# Patient Record
Sex: Female | Born: 2000 | Race: White | Hispanic: No | Marital: Married | Smoking: Never smoker
Health system: Southern US, Community
[De-identification: ages and names within clinical notes are randomized; demographics above are authoritative.]

## PROBLEM LIST (undated history)

## (undated) DIAGNOSIS — R519 Headache, unspecified: Secondary | ICD-10-CM

## (undated) DIAGNOSIS — F419 Anxiety disorder, unspecified: Secondary | ICD-10-CM

## (undated) DIAGNOSIS — R51 Headache: Secondary | ICD-10-CM

## (undated) DIAGNOSIS — D649 Anemia, unspecified: Secondary | ICD-10-CM

## (undated) HISTORY — DX: Anemia, unspecified: D64.9

---
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
## 2018-01-10 DIAGNOSIS — M25561 Pain in right knee: Secondary | ICD-10-CM | POA: Diagnosis not present

## 2018-01-17 DIAGNOSIS — S8391XD Sprain of unspecified site of right knee, subsequent encounter: Secondary | ICD-10-CM | POA: Diagnosis not present

## 2018-08-19 ENCOUNTER — Other Ambulatory Visit: Payer: Self-pay

## 2018-08-19 ENCOUNTER — Ambulatory Visit (INDEPENDENT_AMBULATORY_CARE_PROVIDER_SITE_OTHER): Payer: Managed Care, Other (non HMO) | Admitting: Family Medicine

## 2018-08-19 DIAGNOSIS — R5383 Other fatigue: Secondary | ICD-10-CM | POA: Diagnosis not present

## 2018-08-19 NOTE — Progress Notes (Signed)
   Subjective:    Patient ID: Nicole Mckinney, female    DOB: 28-Jul-2000, 18 y.o.   MRN: [id number]  HPIfatigue and dizziness. Comes and goes since last march. Dizziness when standing up quickly.  Patient also has substantial fatigue.  Worse in recent months.  Claims no depression.  Claims no distress.  She is tired and fatigued.  She and family concerned something going on.  With her physical status.  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  Location: Patient: home Provider: office   I discussed the limitations of evaluation and management by telemedicine and the availability of in person appointments. The patient expressed understanding and agreed to proceed.  History of Present Illness:    Observations/Objective:   Assessment and Plan:   Follow Up Instructions:    I discussed the assessment and treatment plan with the patient. The patient was provided an opportunity to ask questions and all were answered. The patient agreed with the plan and demonstrated an understanding of the instructions.   The patient was advised to call back or seek an in-person evaluation if the symptoms worsen or if the condition fails to improve as anticipated.  I provided of non-face-to-face time during this encounter.       Review of Systems No headache, no major weight loss or weight gain, no chest pain no back pain abdominal pain no change in bowel habits complete ROS otherwise negative     Objective:   Physical Exam   Virtual visit     Assessment & Plan:  Impression fatigue nonspecific we will check some blood work as requested.  Does have a history of anemia.  2.  Lightheadedness transient likely vasovagal and/or orthostatic discussed.

## 2018-08-20 ENCOUNTER — Encounter: Payer: Self-pay | Admitting: Family Medicine

## 2018-08-21 LAB — BASIC METABOLIC PANEL
BUN/Creatinine Ratio: 9 (ref 9–23)
BUN: 7 mg/dL (ref 6–20)
CO2: 21 mmol/L (ref 20–29)
Calcium: 9.4 mg/dL (ref 8.7–10.2)
Chloride: 102 mmol/L (ref 96–106)
Creatinine, Ser: 0.74 mg/dL (ref 0.57–1.00)
GFR calc Af Amer: 136 mL/min/{1.73_m2} (ref >59)
GFR calc non Af Amer: 118 mL/min/{1.73_m2} (ref >59)
Glucose: 76 mg/dL (ref 65–99)
Potassium: 4.2 mmol/L (ref 3.5–5.2)
Sodium: 138 mmol/L (ref 134–144)

## 2018-08-21 LAB — CBC WITH DIFFERENTIAL/PLATELET
Basophils Absolute: 0 10*3/uL (ref 0.0–0.2)
Basos: 1 %
EOS (ABSOLUTE): 0.1 10*3/uL (ref 0.0–0.4)
Eos: 2 %
Hematocrit: 33.9 % — ABNORMAL LOW (ref 34.0–46.6)
Hemoglobin: 11 g/dL — ABNORMAL LOW (ref 11.1–15.9)
Immature Grans (Abs): 0 10*3/uL (ref 0.0–0.1)
Immature Granulocytes: 0 %
Lymphocytes Absolute: 2.4 10*3/uL (ref 0.7–3.1)
Lymphs: 37 %
MCH: 27 pg (ref 26.6–33.0)
MCHC: 32.4 g/dL (ref 31.5–35.7)
MCV: 83 fL (ref 79–97)
Monocytes Absolute: 0.5 10*3/uL (ref 0.1–0.9)
Monocytes: 7 %
Neutrophils Absolute: 3.5 10*3/uL (ref 1.4–7.0)
Neutrophils: 53 %
Platelets: 360 10*3/uL (ref 150–450)
RBC: 4.07 x10E6/uL (ref 3.77–5.28)
RDW: 15.1 % (ref 11.7–15.4)
WBC: 6.5 10*3/uL (ref 3.4–10.8)

## 2018-08-21 LAB — HEPATIC FUNCTION PANEL
ALT: 14 IU/L (ref 0–32)
AST: 19 IU/L (ref 0–40)
Albumin: 3.9 g/dL (ref 3.9–5.0)
Alkaline Phosphatase: 76 IU/L (ref 39–117)
Bilirubin Total: <0.2 mg/dL (ref 0.0–1.2)
Bilirubin, Direct: 0.07 mg/dL (ref 0.00–0.40)
Total Protein: 6.7 g/dL (ref 6.0–8.5)

## 2018-08-21 LAB — LIPID PANEL
Chol/HDL Ratio: 4.2 ratio (ref 0.0–4.4)
Cholesterol, Total: 175 mg/dL — ABNORMAL HIGH (ref 100–169)
HDL: 42 mg/dL (ref >39)
LDL Calculated: 83 mg/dL (ref 0–109)
Triglycerides: 252 mg/dL — ABNORMAL HIGH (ref 0–89)
VLDL Cholesterol Cal: 50 mg/dL — ABNORMAL HIGH (ref 5–40)

## 2018-08-21 LAB — TSH: TSH: 4.14 u[IU]/mL (ref 0.450–4.500)

## 2018-11-11 ENCOUNTER — Encounter: Payer: BLUE CROSS/BLUE SHIELD | Admitting: Adult Health

## 2018-11-20 ENCOUNTER — Encounter: Payer: BLUE CROSS/BLUE SHIELD | Admitting: Advanced Practice Midwife

## 2018-12-04 ENCOUNTER — Ambulatory Visit (INDEPENDENT_AMBULATORY_CARE_PROVIDER_SITE_OTHER): Payer: 59 | Admitting: Advanced Practice Midwife

## 2018-12-04 ENCOUNTER — Other Ambulatory Visit: Payer: Self-pay

## 2018-12-04 ENCOUNTER — Encounter: Payer: Self-pay | Admitting: Advanced Practice Midwife

## 2018-12-04 VITALS — BP 118/84 | HR 96 | Ht 65.0 in | Wt 124.0 lb

## 2018-12-04 DIAGNOSIS — L700 Acne vulgaris: Secondary | ICD-10-CM

## 2018-12-04 DIAGNOSIS — D509 Iron deficiency anemia, unspecified: Secondary | ICD-10-CM

## 2018-12-04 DIAGNOSIS — R5383 Other fatigue: Secondary | ICD-10-CM | POA: Diagnosis not present

## 2018-12-04 LAB — POCT HEMOGLOBIN: Hemoglobin: 11.9 g/dL (ref 11–14.6)

## 2018-12-04 MED ORDER — DROSPIRENONE-ETHINYL ESTRADIOL 3-0.02 MG PO TABS: 1.0000 | ORAL_TABLET | Freq: Every day | ORAL | 11 refills | Status: DC

## 2018-12-04 NOTE — Progress Notes (Signed)
[facility] Visit  Patient name: Nicole Mckinney MRN [id number]  Date of birth: 2000-09-09  CC & HPI:  LOMETA RIGGIN is a 18 y.o. Caucasian female presenting today for birth contril refill. ON Yaz for 1 year from dermatologist (never had sex) but derm wouldn't continue beyond 1 year  ??  Pt says she had bloodwork that says she is B12 deficient, can't find that anywhere. At any rate, would need to see PCP for injections I imagine, as we don't carry that here. On FeSO4 orally since June for hgb 11.   Pertinent History Reviewed:  Medical & Surgical Hx:   Past Medical History:  Diagnosis Date  . Headache    No past surgical history on file. Family History  Problem Relation Age of Onset  . Hyperlipidemia Father     Current Outpatient Medications:  .  NON FORMULARY, Iron dietary supplement 65 mg, Disp: , Rfl:  .  PRESCRIPTION MEDICATION, Nikki 3 mg, Disp: , Rfl:  .  albuterol (PROVENTIL HFA;VENTOLIN HFA) 108 (90 BASE) MCG/ACT inhaler, Inhale 2 puffs into the lungs every 6 (six) hours as needed for wheezing. (Patient not taking: Reported on 12/04/2018), Disp: 1 Inhaler, Rfl: 2 .  diclofenac (VOLTAREN) 75 MG EC tablet, TAKE 1 TABLET BY MOUTH TWICE A DAY (Patient not taking: Reported on 08/19/2018), Disp: 42 tablet, Rfl: 0 .  doxycycline (DORYX) 100 MG EC tablet, Take 100 mg by mouth 2 (two) times daily. acne, Disp: , Rfl:  .  drospirenone-ethinyl estradiol (YAZ) 3-0.02 MG tablet, Take 1 tablet by mouth daily., Disp: 1 Package, Rfl: 11 .  fluticasone (FLONASE) 50 MCG/ACT nasal spray, Place 2 sprays into both nostrils daily. (Patient not taking: Reported on 08/01/2017), Disp: 16 g, Rfl: 5 .  omeprazole (PRILOSEC) 20 MG capsule, TAKE 1 CAPSULE BY MOUTH EVERY DAY (Patient not taking: Reported on 08/01/2017), Disp: 30 capsule, Rfl: 0 .  sucralfate (CARAFATE) 1 GM/10ML suspension, Take 10 mLs (1 g total) by mouth 4 (four) times daily -  with meals and at bedtime. (Patient not taking:  Reported on 08/01/2017), Disp: 420 mL, Rfl: 0 Social History: Reviewed -  reports that she has never smoked. She has never used smokeless tobacco.  Review of Systems:   Constitutional: Negative for fever and chills Eyes: Negative for visual disturbances Respiratory: Negative for shortness of breath, dyspnea Cardiovascular: Negative for chest pain or palpitations  Gastrointestinal: Negative for vomiting, diarrhea and constipation; no abdominal pain Genitourinary: Negative for dysuria and urgency, vaginal irritation or itching Musculoskeletal: Negative for back pain, joint pain, myalgias  Neurological: Negative for dizziness and headaches    Objective Findings:    Physical Examination: Vitals:   12/04/18 1008  BP: 118/84  Pulse: 96   General appearance - well appearing, and in no distress Mental status - alert, oriented to person, place, and time Chest:  Normal respiratory effort Heart - normal rate and regular rhythm Pelvic: deferred Musculoskeletal:  Normal range of motion without pain Extremities:  No edema    Results for orders placed or performed in visit on 12/04/18 (from the past 24 hour(s))  POCT hemoglobin   Collection Time: 12/04/18 10:56 AM  Result Value Ref Range   Hemoglobin 11.9 11 - 14.6 g/dL      Assessment & Plan:  A:   Acne management w/COCs P:  rx yaz for one more year--smoking warning given    No follow-ups on file.  Christin Fudge CNM 12/04/2018 11:43 AM

## 2018-12-18 ENCOUNTER — Telehealth: Payer: Self-pay | Admitting: Family Medicine

## 2018-12-18 NOTE — Telephone Encounter (Signed)
Patient woke up Sunday with a "knot" behind her ear and one in her neck area. Painful to touch.  Mom said that the one in her neck is getting bigger and is noticeable now. Before she couldn't see it, only feel it, but now she can. No fever or any other symptoms other than the knots. I wasn't sure where to schedule her at. Work-in today or put on Dr. Jeannine Kitten schedule tomorrow? In office or by phone?

## 2018-12-18 NOTE — Telephone Encounter (Signed)
Face to face with me tom

## 2018-12-19 ENCOUNTER — Encounter: Payer: Self-pay | Admitting: Family Medicine

## 2018-12-19 ENCOUNTER — Ambulatory Visit (INDEPENDENT_AMBULATORY_CARE_PROVIDER_SITE_OTHER): Payer: Managed Care, Other (non HMO) | Admitting: Family Medicine

## 2018-12-19 ENCOUNTER — Other Ambulatory Visit: Payer: Self-pay

## 2018-12-19 VITALS — BP 116/78 | Temp 97.7°F | Wt 123.6 lb

## 2018-12-19 DIAGNOSIS — I889 Nonspecific lymphadenitis, unspecified: Secondary | ICD-10-CM | POA: Diagnosis not present

## 2018-12-19 MED ORDER — DOXYCYCLINE HYCLATE 100 MG PO TABS: ORAL_TABLET | ORAL | 0 refills | Status: DC

## 2018-12-19 NOTE — Progress Notes (Signed)
   Subjective:    Patient ID: Nicole Mckinney, female    DOB: 2000-10-10, 18 y.o.   MRN: [id number]  HPI Pt here today for some knots on neck and jaw area. Pt has one spot on her neck, one knot behind right ear and one knot like area on jaw line on right side. Pt states these areas are painful. Pt has been taking Ibuprofen for pain with mild relief. These knot like areas came up Friday night. Pt states she is having jaw pain with the knot like area on jaw.    Review of Systems No headache no chest pain no back pain    Objective:   Physical Exam  Alert vitals stable, NAD. Blood pressure good on repeat. HEENT normal. Lungs clear. Heart regular rate and rhythm. Positive preauricular inflamed lymph node positive postauricular inflamed lymph node positive inflamed lymph node at the jaw.  1 pimple on left side of face none evident right side of face      Assessment & Plan:  Impression focal lymphadenitis.  Etiology unclear.  Will cover with Doxy 100 twice daily 10 days symptomatic care discussed.  Of note no history of cat scratch.  No systemic symptoms.

## 2019-04-02 ENCOUNTER — Ambulatory Visit (INDEPENDENT_AMBULATORY_CARE_PROVIDER_SITE_OTHER): Payer: Managed Care, Other (non HMO) | Admitting: Family Medicine

## 2019-04-02 ENCOUNTER — Encounter: Payer: Self-pay | Admitting: Family Medicine

## 2019-04-02 ENCOUNTER — Other Ambulatory Visit: Payer: Self-pay

## 2019-04-02 DIAGNOSIS — Z20828 Contact with and (suspected) exposure to other viral communicable diseases: Secondary | ICD-10-CM

## 2019-04-02 DIAGNOSIS — J329 Chronic sinusitis, unspecified: Secondary | ICD-10-CM | POA: Diagnosis not present

## 2019-04-02 DIAGNOSIS — J310 Chronic rhinitis: Secondary | ICD-10-CM | POA: Diagnosis not present

## 2019-04-02 DIAGNOSIS — Z20822 Contact with and (suspected) exposure to covid-19: Secondary | ICD-10-CM

## 2019-04-02 MED ORDER — CEFPROZIL 500 MG PO TABS: ORAL_TABLET | ORAL | 0 refills | Status: DC

## 2019-04-02 NOTE — Progress Notes (Signed)
   Subjective:  Audio only  Patient ID: Nicole Mckinney, female    DOB: October 21, 2000, 18 y.o.   MRN: [id number]  HPI Pt has been having a feeling of swelling in her throat. Pt states each morning she wakes [place] she has really bad discomfort in throat. Allergy med is helping some. Pt believes it is allergies. This has been going on for one week. No other symptoms.   Virtual Visit via Telephone Note  I connected with Nicole Mckinney on 04/02/19 at 11:00 AM EST by telephone and verified that I am speaking with the correct person using two identifiers.  Location: Patient: home Provider: office   I discussed the limitations, risks, security and privacy concerns of performing an evaluation and management service by telephone and the availability of in person appointments. I also discussed with the patient that there may be a patient responsible charge related to this service. The patient expressed understanding and agreed to proceed.   History of Present Illness:    Observations/Objective:   Assessment and Plan:   Follow Up Instructions:    I discussed the assessment and treatment plan with the patient. The patient was provided an opportunity to ask questions and all were answered. The patient agreed with the plan and demonstrated an understanding of the instructions.   The patient was advised to call back or seek an in-person evaluation if the symptoms worsen or if the condition fails to improve as anticipated.  I provided 20 minutes of non-face-to-face time during this encounter.  Discomfort in throat   Worse in the morn  Uses zyrtec helps a little   Usually zyrtec helps   Working as a Psychologist, counselling, LPN  Review of Systems No headache no chest pain no shortness of breath    Objective:   Physical Exam  Virtual      Assessment & Plan:  Impression upper respiratory symptoms with normal sore throat and sinus congestion.  Patient concerned about  potential move into the chest.  Albuterol on hand for as needed for wheezes.  Will cover with antibiotics.  Recommend Covid testing patient works in a Conservation officer, nature

## 2019-04-07 ENCOUNTER — Telehealth: Payer: Self-pay | Admitting: Family Medicine

## 2019-04-07 NOTE — Telephone Encounter (Signed)
Tried to contact patient to see if she had COVID test done. Left message to return call.

## 2019-04-09 NOTE — Telephone Encounter (Signed)
Pt did not get COVID testing. Pt states she took the prescribed med and began to feel better. Pt is feeling a lot better

## 2019-04-30 ENCOUNTER — Encounter: Payer: Self-pay | Admitting: Family Medicine

## 2019-05-01 ENCOUNTER — Encounter: Payer: Self-pay | Admitting: Family Medicine

## 2019-05-21 ENCOUNTER — Other Ambulatory Visit: Payer: Self-pay

## 2019-05-21 ENCOUNTER — Ambulatory Visit (INDEPENDENT_AMBULATORY_CARE_PROVIDER_SITE_OTHER): Payer: Managed Care, Other (non HMO) | Admitting: Family Medicine

## 2019-05-21 ENCOUNTER — Encounter: Payer: Self-pay | Admitting: Family Medicine

## 2019-05-21 VITALS — BP 124/82 | Temp 97.8°F | Wt 120.0 lb

## 2019-05-21 DIAGNOSIS — R238 Other skin changes: Secondary | ICD-10-CM | POA: Diagnosis not present

## 2019-05-21 DIAGNOSIS — T148XXA Other injury of unspecified body region, initial encounter: Secondary | ICD-10-CM

## 2019-05-21 NOTE — Progress Notes (Signed)
   Subjective:    Patient ID: Nicole Mckinney, female    DOB: [birth year], 19 y.o.   MRN: [id number]  HPI Pt here today for bruising on both leg and thigh areas on both legs. Right leg is worse. Pt states she does not know where they come from or how she gets them. Pt states they tend to turn greenish in color like they are going away but then they turn dark.   Pt has experienced bruising coming up on her anterior thighs for the past couple months.  Recalls no injury    No bruising elsewhere  No bleeding from any orifice  Not on any medications that contribute to bleeding  Concerns patient some    Objective:   Physical Exam Alert vitals stable, NAD. Blood pressure good on repeat. HEENT normal. Lungs clear. Heart regular rate and rhythm. aging bruises anterior thigh no other bruises elsewhere       Assessment & Plan:  Impression easy bruisability past 2 months.  We will do screening CBC to be on the safe side.  But doubt of bleeding diathesis at this time based on presentation.  Warning signs discussed

## 2019-05-22 ENCOUNTER — Encounter: Payer: Self-pay | Admitting: Family Medicine

## 2019-05-22 LAB — CBC WITH DIFFERENTIAL/PLATELET
Basophils Absolute: 0 10*3/uL (ref 0.0–0.2)
Basos: 1 %
EOS (ABSOLUTE): 0 10*3/uL (ref 0.0–0.4)
Eos: 1 %
Hematocrit: 37.3 % (ref 34.0–46.6)
Hemoglobin: 12.6 g/dL (ref 11.1–15.9)
Immature Grans (Abs): 0 10*3/uL (ref 0.0–0.1)
Immature Granulocytes: 0 %
Lymphocytes Absolute: 1.5 10*3/uL (ref 0.7–3.1)
Lymphs: 31 %
MCH: 30.1 pg (ref 26.6–33.0)
MCHC: 33.8 g/dL (ref 31.5–35.7)
MCV: 89 fL (ref 79–97)
Monocytes Absolute: 0.4 10*3/uL (ref 0.1–0.9)
Monocytes: 8 %
Neutrophils Absolute: 3 10*3/uL (ref 1.4–7.0)
Neutrophils: 59 %
Platelets: 313 10*3/uL (ref 150–450)
RBC: 4.19 x10E6/uL (ref 3.77–5.28)
RDW: 12.4 % (ref 11.7–15.4)
WBC: 4.9 10*3/uL (ref 3.4–10.8)

## 2019-10-21 ENCOUNTER — Ambulatory Visit: Payer: 59 | Admitting: Women's Health

## 2019-11-03 ENCOUNTER — Ambulatory Visit (INDEPENDENT_AMBULATORY_CARE_PROVIDER_SITE_OTHER): Payer: Managed Care, Other (non HMO) | Admitting: Women's Health

## 2019-11-03 ENCOUNTER — Encounter: Payer: Self-pay | Admitting: Women's Health

## 2019-11-03 VITALS — BP 126/80 | HR 89 | Ht 65.0 in | Wt 115.0 lb

## 2019-11-03 DIAGNOSIS — Z3041 Encounter for surveillance of contraceptive pills: Secondary | ICD-10-CM | POA: Diagnosis not present

## 2019-11-03 MED ORDER — DROSPIRENONE-ETHINYL ESTRADIOL 3-0.02 MG PO TABS: 1.0000 | ORAL_TABLET | Freq: Every day | ORAL | 11 refills | Status: DC

## 2019-11-03 NOTE — Progress Notes (Signed)
   GYN VISIT Patient name: Nicole Mckinney MRN [id number]  Date of birth: April 29, 2000 Chief Complaint:   Contraception (needs refill)  History of Present Illness:   Nicole Mckinney is a 19 y.o. G0P0000 Caucasian female being seen today for refill on COCs.  Was started on generic Yaz years ago by dermatologist for acne, doing really well on it, regulates periods. Not sexually active, never has been. Does not smoke, no h/o HTN, DVT/PE, CVA, MI, or migraines w/ aura.     Depression screen [facility] 2/9 08/01/2017  Decreased Interest 0  Down, Depressed, Hopeless 0  PHQ - 2 Score 0    No LMP recorded. (Menstrual status: Oral contraceptives). The current method of family planning is abstinence and OCP (estrogen/progesterone).  Last pap <21yo. Results were:  n/a Review of Systems:   Pertinent items are noted in HPI Denies fever/chills, dizziness, headaches, visual disturbances, fatigue, shortness of breath, chest pain, abdominal pain, vomiting, abnormal vaginal discharge/itching/odor/irritation, problems with periods, bowel movements, urination, or intercourse unless otherwise stated above.  Pertinent History Reviewed:  Reviewed past medical,surgical, social, obstetrical and family history.  Reviewed problem list, medications and allergies. Physical Assessment:   Vitals:   11/03/19 0938  BP: 126/80  Pulse: 89  Weight: 115 lb (52.2 kg)  Height: 5\' 5"  (1.651 m)  Body mass index is 19.14 kg/m.       Physical Examination:   General appearance: alert, well appearing, and in no distress  Mental status: alert, oriented to person, place, and time  Skin: warm & dry   Cardiovascular: normal heart rate noted  Respiratory: normal respiratory effort, no distress  Abdomen: soft, non-tender   Pelvic: examination not indicated  Extremities: no edema   Chaperone: n/a    No results found for this or any previous visit (from the past 24 hour(s)).  Assessment & Plan:  1) Contraception  management> refilled COCs, uses for acne and period management, not sexually active   Meds:  Meds ordered this encounter  Medications  . drospirenone-ethinyl estradiol (YAZ) 3-0.02 MG tablet    Sig: Take 1 tablet by mouth daily.    Dispense:  30 tablet    Refill:  11    Order Specific Question:   Supervising Provider    Answer:   [birth year]20[birth year]1996 H [[id number]]    No orders of the defined types were placed in this encounter.   Return for after 11/20 for , Pap & physical.  12/20 CNM, [facility] 11/03/2019 10:13 AM

## 2020-03-19 ENCOUNTER — Encounter (HOSPITAL_COMMUNITY): Payer: Self-pay | Admitting: Emergency Medicine

## 2020-03-19 ENCOUNTER — Emergency Department (HOSPITAL_COMMUNITY)
Admission: EM | Admit: 2020-03-19 | Discharge: 2020-03-19 | Disposition: A | Discharge status: Home / Self Care | Payer: Managed Care, Other (non HMO) | Attending: Emergency Medicine | Admitting: Emergency Medicine

## 2020-03-19 ENCOUNTER — Other Ambulatory Visit: Payer: Self-pay

## 2020-03-19 DIAGNOSIS — K208 Other esophagitis without bleeding: Secondary | ICD-10-CM

## 2020-03-19 DIAGNOSIS — K209 Esophagitis, unspecified without bleeding: Secondary | ICD-10-CM | POA: Diagnosis not present

## 2020-03-19 DIAGNOSIS — J45909 Unspecified asthma, uncomplicated: Secondary | ICD-10-CM | POA: Diagnosis not present

## 2020-03-19 DIAGNOSIS — R079 Chest pain, unspecified: Secondary | ICD-10-CM | POA: Diagnosis present

## 2020-03-19 HISTORY — DX: Anxiety disorder, unspecified: F41.9

## 2020-03-19 LAB — POC URINE PREG, ED: Preg Test, Ur: NEGATIVE

## 2020-03-19 MED ORDER — ONDANSETRON 8 MG PO TBDP
8.0000 mg | ORAL_TABLET | Freq: Once | ORAL | Status: AC
  Administered 2020-03-19: 8 mg via ORAL
  Filled 2020-03-19: qty 1

## 2020-03-19 NOTE — ED Notes (Signed)
Patient discharged to home.  All discharge instructions reviewed.  Patient able to verbalize understanding via teachback method.  Ambulatory out of ED.  

## 2020-03-19 NOTE — Discharge Instructions (Signed)
Make sure to take all pills with a full glass of water.  Do not lay down for 30 minutes after taking a pill.

## 2020-03-19 NOTE — ED Triage Notes (Addendum)
Pt c/o chest pain that started about 1 hour prior to arrival. Pt states it also feels like something is stuck in her throat. Pt states she took her Zoloft tonight prior to having chest pain. Pt very anxious in triage.

## 2020-03-19 NOTE — ED Provider Notes (Signed)
[facility] EMERGENCY DEPARTMENT Provider Note   CSN: [id number] Arrival date & time: 03/19/20  0002   History Chief Complaint  Patient presents with  . Chest Pain    Nicole Mckinney is a 19 y.o. female.  The history is provided by the patient.  Chest Pain She states that she took a pill tonight and feels like it got stuck in her esophagus.  And feels very uncomfortable.  While she was in the ED waiting for me to see her, she did vomit and the pill came up and she feels like she is back to normal.  She does admit that she took the pill without drinking any liquids with it, and lay down shortly after taking the pill.  She denies any chest pain and she currently is not having any nausea.  Past Medical History:  Diagnosis Date  . Anxiety   . Headache     Patient Active Problem List   Diagnosis Date Noted  . Anemia, iron deficiency 02/06/2014  . Exercise-induced asthma 07/22/2013  . Heart murmur 04/14/2013  . Allergic rhinitis 12/17/2012    History reviewed. No pertinent surgical history.   OB History    Gravida  0   Para  0   Term  0   Preterm  0   AB  0   Living  0     SAB  0   IAB  0   Ectopic  0   Multiple  0   Live Births  0           Family History  Problem Relation Age of Onset  . Hyperlipidemia Father     Social History   Tobacco Use  . Smoking status: Never Smoker  . Smokeless tobacco: Never Used  Vaping Use  . Vaping Use: Never used  Substance Use Topics  . Alcohol use: No  . Drug use: No    Home Medications Prior to Admission medications   Medication Sig Start Date End Date Taking? Authorizing Provider  drospirenone-ethinyl estradiol (YAZ) 3-0.02 MG tablet Take 1 tablet by mouth daily. 11/03/19   Cheral Marker, CNM  omeprazole (PRILOSEC) 20 MG capsule TAKE 1 CAPSULE BY MOUTH EVERY DAY Patient not taking: Reported on 08/01/2017 11/27/16 03/19/20  Campbell Riches, NP    Allergies    Patient has no known  allergies.  Review of Systems   Review of Systems  Cardiovascular: Positive for chest pain.  All other systems reviewed and are negative.   Physical Exam Updated Vital Signs BP 115/72   Pulse 77   Temp 98 F (36.7 C) (Oral)   Resp (!) 27   Ht 5\' 5"  (1.651 m)   Wt 52.2 kg   SpO2 100%   BMI 19.14 kg/m   Physical Exam Vitals and nursing note reviewed.   19 year old female, resting comfortably and in no acute distress. Vital signs are significant for elevated respiratory rate. Oxygen saturation is 100%, which is normal. Head is normocephalic and atraumatic. PERRLA, EOMI. Oropharynx is clear. Neck is nontender and supple without adenopathy or JVD. Back is nontender and there is no CVA tenderness. Lungs are clear without rales, wheezes, or rhonchi. Chest is nontender. Heart has regular rate and rhythm without murmur. Abdomen is soft, flat, nontender without masses or hepatosplenomegaly and peristalsis is normoactive. Extremities have no cyanosis or edema, full range of motion is present. Skin is warm and dry without rash. Neurologic: Mental status is normal, cranial nerves  are intact, there are no motor or sensory deficits.  ED Results / Procedures / Treatments   Labs (all labs ordered are listed, but only abnormal results are displayed) Labs Reviewed  BASIC METABOLIC PANEL  CBC  POC URINE PREG, ED  TROPONIN I (HIGH SENSITIVITY)    EKG EKG Interpretation  Date/Time:  Friday March 19 2020 00:25:54 EST Ventricular Rate:  86 PR Interval:  132 QRS Duration: 62 QT Interval:  336 QTC Calculation: 402 R Axis:   82 Text Interpretation: Normal sinus rhythm Nonspecific ST abnormality Abnormal ECG No old tracing to compare Confirmed by Dione Booze ([id number]) on 03/19/2020 12:35:54 AM  Procedures Procedures   Medications Ordered in ED Medications  ondansetron (ZOFRAN-ODT) disintegrating tablet 8 mg (8 mg Oral Given 03/19/20 0446)    ED Course  I have reviewed the  triage vital signs and the nursing notes.  Pertinent lab results that were available during my care of the patient were reviewed by me and considered in my medical decision making (see chart for details).  MDM Rules/Calculators/A&P Pill not stuck in her esophagus, resolved when she vomited the pill up.  No evidence of actual esophagitis.  Old records are reviewed, and she has no relevant past visits.  ECG is unremarkable.  Patient was reassured, advised to make sure she takes pills with a full glass of water and does not lay down for 30 minutes following taking pills.  Return precautions discussed.  Final Clinical Impression(s) / ED Diagnoses Final diagnoses:  Pill esophagitis    Rx / DC Orders ED Discharge Orders    None       Dione Booze, MD 03/19/20 [phone]

## 2020-09-20 DIAGNOSIS — E039 Hypothyroidism, unspecified: Secondary | ICD-10-CM | POA: Insufficient documentation

## 2020-11-19 ENCOUNTER — Other Ambulatory Visit: Payer: Self-pay | Admitting: Women's Health

## 2021-04-03 NOTE — L&D Delivery Note (Signed)
Obstetrical Delivery Note   Date of Delivery:   02/04/2022 Primary OB:   [facility] Gestational Age/EDD: [redacted]w[redacted]d Reason for Admission: Post dates IOL Antepartum complications: none  Delivered By:   Durene Romans MD  Delivery Type:   vacuum, low  Delivery Details:   Patient diagnosed with chorio just prior to starting to push. IVF bolus, tylenol given and ampicillin infusing with plan for gentamicin after the ampicillin was done. Patient pushing well and assessed after 30 minutes and the baby was LOA and +3 with BPD to +2. Pelvis felt adequate with EFW 3500gm. FHR tachy to the 200s-210s and I recommended operative vaginal delivery. Patient pushing well and declined OVD. NICU present. She pushed just to +4 and FHR in the 90s and stayed there and I told her I recommended vacuum assistance. Risk of larger tear, fetal injury including hematoma discussed with her and she was amenable to this. Kiwi vacuum applied to flexion point and FHR normal. With next two contractions, kiwi used per manufacturer's instructions and patient delivered the child; she did have a tight skin band at the introitus so patient amenable to right mediolateral episiotomy, which facilitated delivery with the second contraction and vacuum usage. Once head was delivered, suction released and baby was delivered without issue. 1 minue delayed cord clamping done and then infant handed to NICU team.  Anesthesia:    epidural Intrapartum complications: Chorioamnionitis GBS:    Positive   Laceration:    2nd degree. Repaired with 2-0 vicryl in the usual fashion and 3-0 monocryl for the skin in a subcuticular fashion Episiotomy:    Right mediolateral with 2nd degree extension Rectal exam:   Negative pre and post repair Placenta:    Delivered and expressed via active management. Intact: yes. To pathology: yes.  Delayed Cord Clamping: yes Estimated Blood Loss:  754mL Atony responsive to massage, pitocin and Lysteda  Baby:    Liveborn  female, APGARs 6/7/8, weight 3770gm Arterial: pH 7.19, CO2 60, bicar 22.9 Venous: pH 7.24, CO2 44  Durene Romans. MD Attending Center for [facility])

## 2021-06-03 ENCOUNTER — Ambulatory Visit (INDEPENDENT_AMBULATORY_CARE_PROVIDER_SITE_OTHER): Payer: 59 | Admitting: *Deleted

## 2021-06-03 ENCOUNTER — Other Ambulatory Visit: Payer: Self-pay

## 2021-06-03 ENCOUNTER — Encounter: Payer: Self-pay | Admitting: *Deleted

## 2021-06-03 VITALS — BP 119/80 | HR 106 | Ht 65.0 in | Wt 129.0 lb

## 2021-06-03 DIAGNOSIS — Z3201 Encounter for pregnancy test, result positive: Secondary | ICD-10-CM

## 2021-06-03 LAB — POCT URINE PREGNANCY: Preg Test, Ur: POSITIVE — AB

## 2021-06-03 NOTE — Progress Notes (Signed)
? ?  NURSE VISIT- PREGNANCY CONFIRMATION  ? ?SUBJECTIVE:  ?Nicole Mckinney is a 21 y.o. G1P0000 female at [redacted]w[redacted]d by certain LMP of Patient's last menstrual period was 04/22/2021. Here for pregnancy confirmation.  Home pregnancy test: positive x 5.   She reports nausea and vomiting.  She is taking prenatal vitamins.   ? ?OBJECTIVE:  ?BP 119/80 (BP Location: Left Arm, Patient Position: Sitting, Cuff Size: Normal)   Pulse (!) 106   Ht 5\' 5"  (1.651 m)   Wt 129 lb (58.5 kg)   LMP 04/22/2021   BMI 21.47 kg/m?   ?Appears well, in no apparent distress ? ?Results for orders placed or performed in visit on 06/03/21 (from the past 24 hour(s))  ?POCT urine pregnancy  ? Collection Time: 06/03/21  9:45 AM  ?Result Value Ref Range  ? Preg Test, Ur Positive (A) Negative  ? ? ?ASSESSMENT: ?Positive pregnancy test, [redacted]w[redacted]d by LMP   ? ?PLAN: ?Schedule for dating ultrasound in 2 weeks ?Prenatal vitamins: continue   ?Nausea medicines: not currently needed   ?OB packet given: Yes ? ?[redacted]w[redacted]d  ?06/03/2021 ?9:53 AM ? ?

## 2021-06-10 ENCOUNTER — Telehealth: Payer: Self-pay | Admitting: Women's Health

## 2021-06-10 NOTE — Telephone Encounter (Signed)
Patient called stating that she is a couple weeks into her pregnancy and she has a sinus infection and she was told that there is only a limited of things she can take while this early pregnant. She would like to speak with a nurse to see what she is able to take for it. Please contact pt  ?

## 2021-06-10 NOTE — Telephone Encounter (Signed)
Spoke with patient. Advised that she can use tylenol prn for headache, fever and pain. Advised that she can use saline nasal spray and vicks for congestion. Pt agreeable and no other questions at this time.  ?

## 2021-06-15 ENCOUNTER — Encounter: Payer: Self-pay | Admitting: *Deleted

## 2021-06-20 ENCOUNTER — Other Ambulatory Visit: Payer: Self-pay | Admitting: Obstetrics & Gynecology

## 2021-06-20 ENCOUNTER — Other Ambulatory Visit: Payer: Self-pay

## 2021-06-20 ENCOUNTER — Ambulatory Visit (INDEPENDENT_AMBULATORY_CARE_PROVIDER_SITE_OTHER): Payer: 59

## 2021-06-20 DIAGNOSIS — O3680X0 Pregnancy with inconclusive fetal viability, not applicable or unspecified: Secondary | ICD-10-CM

## 2021-06-20 NOTE — Progress Notes (Signed)
[place] 8+3 wks,single IUP with YS,FHR 161 bpm,normal ovaries,CRL 17.15 mm ?

## 2021-06-24 ENCOUNTER — Other Ambulatory Visit: Payer: 59

## 2021-07-15 ENCOUNTER — Other Ambulatory Visit: Payer: Self-pay | Admitting: Obstetrics & Gynecology

## 2021-07-15 DIAGNOSIS — Z3682 Encounter for antenatal screening for nuchal translucency: Secondary | ICD-10-CM

## 2021-07-18 ENCOUNTER — Ambulatory Visit: Payer: 59 | Admitting: *Deleted

## 2021-07-18 ENCOUNTER — Other Ambulatory Visit (HOSPITAL_COMMUNITY)
Admission: RE | Admit: 2021-07-18 | Discharge: 2021-07-18 | Disposition: A | Discharge status: Home / Self Care | Payer: 59 | Source: Ambulatory Visit | Attending: Women's Health | Admitting: Women's Health

## 2021-07-18 ENCOUNTER — Ambulatory Visit (INDEPENDENT_AMBULATORY_CARE_PROVIDER_SITE_OTHER): Payer: 59

## 2021-07-18 ENCOUNTER — Encounter: Payer: Self-pay | Admitting: Women's Health

## 2021-07-18 ENCOUNTER — Ambulatory Visit (INDEPENDENT_AMBULATORY_CARE_PROVIDER_SITE_OTHER): Payer: 59 | Admitting: Women's Health

## 2021-07-18 VITALS — BP 119/73 | HR 95 | Wt 130.0 lb

## 2021-07-18 DIAGNOSIS — Z3401 Encounter for supervision of normal first pregnancy, first trimester: Secondary | ICD-10-CM | POA: Insufficient documentation

## 2021-07-18 DIAGNOSIS — Z113 Encounter for screening for infections with a predominantly sexual mode of transmission: Secondary | ICD-10-CM | POA: Diagnosis present

## 2021-07-18 DIAGNOSIS — Z3682 Encounter for antenatal screening for nuchal translucency: Secondary | ICD-10-CM

## 2021-07-18 DIAGNOSIS — Z124 Encounter for screening for malignant neoplasm of cervix: Secondary | ICD-10-CM

## 2021-07-18 DIAGNOSIS — Z3400 Encounter for supervision of normal first pregnancy, unspecified trimester: Secondary | ICD-10-CM | POA: Insufficient documentation

## 2021-07-18 DIAGNOSIS — Z3A12 12 weeks gestation of pregnancy: Secondary | ICD-10-CM | POA: Insufficient documentation

## 2021-07-18 DIAGNOSIS — Z3402 Encounter for supervision of normal first pregnancy, second trimester: Secondary | ICD-10-CM

## 2021-07-18 LAB — POCT URINALYSIS DIPSTICK OB
Blood, UA: NEGATIVE
Glucose, UA: NEGATIVE
Ketones, UA: NEGATIVE
Leukocytes, UA: NEGATIVE
Nitrite, UA: NEGATIVE
POC,PROTEIN,UA: NEGATIVE

## 2021-07-18 NOTE — Progress Notes (Signed)
[place] 12+3 wks,measurements c/w dates,CRL 51.26 mm,normal ovaries,NB present,NT 1.1 mm,FHR 166 bpm ?

## 2021-07-18 NOTE — Progress Notes (Signed)
? ? ?INITIAL OBSTETRICAL VISIT ?Patient name: Nicole Mckinney MRN [id number]  Date of birth: 11-20-2000 ?Chief Complaint:   ?Initial Prenatal Visit ? ?History of Present Illness:   ?Nicole Mckinney is a 21 y.o. G29P0000 Caucasian female at [redacted]w[redacted]d by LMP c/w u/s at 8 weeks with an Estimated Date of Delivery: 01/27/22 being seen today for her initial obstetrical visit.   ?Patient's last menstrual period was 04/22/2021. ?Her obstetrical history is significant for primigravida.   ?Today she reports no complaints.  ?Last pap never. Results were: N/A ? ? ?  07/18/2021  ?  2:01 PM 08/01/2017  ?  8:46 AM  ?Depression screen PHQ 2/9  ?Decreased Interest 0 0  ?Down, Depressed, Hopeless 0 0  ?PHQ - 2 Score 0 0  ?Altered sleeping 0   ?Tired, decreased energy 1   ?Change in appetite 0   ?Feeling bad or failure about yourself  0   ?Trouble concentrating 0   ?Moving slowly or fidgety/restless 0   ?Suicidal thoughts 0   ?PHQ-9 Score 1   ? ?  ? ?  07/18/2021  ?  2:01 PM  ?GAD 7 : Generalized Anxiety Score  ?Nervous, Anxious, on Edge 1  ?Control/stop worrying 1  ?Worry too much - different things 1  ?Trouble relaxing 0  ?Restless 0  ?Easily annoyed or irritable 0  ?Afraid - awful might happen 0  ?Total GAD 7 Score 3  ? ? ? ?Review of Systems:   ?Pertinent items are noted in HPI ?Denies cramping/contractions, leakage of fluid, vaginal bleeding, abnormal vaginal discharge w/ itching/odor/irritation, headaches, visual changes, shortness of breath, chest pain, abdominal pain, severe nausea/vomiting, or problems with urination or bowel movements unless otherwise stated above.  ?Pertinent History Reviewed:  ?Reviewed past medical,surgical, social, obstetrical and family history.  ?Reviewed problem list, medications and allergies. ?OB History  ?Gravida Para Term Preterm AB Living  ?1 0 0 0 0 0  ?SAB IAB Ectopic Multiple Live Births  ?0 0 0 0 0  ?  ?# Outcome Date GA Lbr Len/2nd Weight Sex Delivery Anes PTL Lv  ?1 Current           ? ?Physical  Assessment:  ? ?Vitals:  ? 07/18/21 1409  ?BP: 119/73  ?Pulse: 95  ?Weight: 130 lb (59 kg)  ?Body mass index is 21.63 kg/m?. ? ?     Physical Examination: ? General appearance - well appearing, and in no distress ? Mental status - alert, oriented to person, place, and time ? Psych:  She has a normal mood and affect ? Skin - warm and dry, normal color, no suspicious lesions noted ? Chest - effort normal, all lung fields clear to auscultation bilaterally ? Heart - normal rate and regular rhythm ? Abdomen - soft, nontender ? Extremities:  No swelling or varicosities noted ? Thin prep pap is done  ? ?Chaperone: Faith Rogue   ? ?TODAY'S NT [place] 12+3 wks,measurements c/w dates,CRL 51.26 mm,normal ovaries,NB present,NT 1.1 mm,FHR 166 bpm ? ?Results for orders placed or performed in visit on 07/18/21 (from the past 24 hour(s))  ?POC Urinalysis Dipstick OB  ? Collection Time: 07/18/21  2:30 PM  ?Result Value Ref Range  ? Color, UA    ? Clarity, UA    ? Glucose, UA Negative Negative  ? Bilirubin, UA    ? Ketones, UA neg   ? Spec Grav, UA    ? Blood, UA neg   ? pH, UA    ?  POC,PROTEIN,UA Negative Negative, Trace, Small (1+), Moderate (2+), Large (3+), 4+  ? Urobilinogen, UA    ? Nitrite, UA neg   ? Leukocytes, UA Negative Negative  ? Appearance    ? Odor    ?  ?Assessment & Plan:  ?1) Low-Risk Pregnancy G1P0000 at [redacted]w[redacted]d with an Estimated Date of Delivery: 01/27/22  ? ?2) Initial OB visit ? ?3) Cervical cancer screening ? ?Meds: No orders of the defined types were placed in this encounter. ? ? ?Initial labs obtained ?Continue prenatal vitamins ?Reviewed n/v relief measures and warning s/s to report ?Reviewed recommended weight gain based on pre-gravid BMI ?Encouraged well-balanced diet ?Genetic & carrier screening discussed: requests Panorama and NT/IT, declines Horizon  ?Ultrasound discussed; fetal survey: requested ?CCNC completed> form faxed if has or is planning to apply for medicaid ?The nature of [facility] with multiple MDs and other Advanced Practice Providers was explained to patient; also emphasized that fellows, residents, and students are part of our team. ? ?Indications for ASA therapy (per uptodate) ?None ? ?Indications for early A1C (per uptodate) ?BMI >=25 (>=23 in Asian women) AND one of the following ?None ? ?Follow-up: Return in about 4 weeks (around 08/15/2021) for LROB, 2nd IT, CNM, in person; then 7wks from now for [facility] w/ anatomy u/s.  ? ?Orders Placed This Encounter  ?Procedures  ? Urine Culture  ? Integrated 1  ? Genetic Screening  ? CBC/D/Plt+RPR+Rh+ABO+RubIgG...  ? POC Urinalysis Dipstick OB  ? ? ?Cheral Marker CNM, WHNP-BC ?07/18/2021 ?2:45 PM  ?

## 2021-07-18 NOTE — Patient Instructions (Signed)
Nicole Mckinney, thank you for choosing our office today! We appreciate the opportunity to meet your healthcare needs. You may receive a short survey by mail, e-mail, or through [employer]. If you are happy with your care we would appreciate if you could take just a few minutes to complete the survey questions. We read all of your comments and take your feedback very seriously. Thank you again for choosing our office.  ?Center for [employer] Team at [facility] ? [facility] & [place] at [facility] ?([address] [place], [place] [address]) ?Entrance C, located off of [address] ?Free 24/7 valet parking  ? Nausea & Vomiting ?Have saltine crackers or pretzels by your bed and eat a few bites before you raise your head out of bed in the morning ?Eat small frequent meals throughout the day instead of large meals ?Drink plenty of fluids throughout the day to stay hydrated, just don't drink a lot of fluids with your meals.  This can make your stomach fill up faster making you feel sick ?Do not brush your teeth right after you eat ?Products with real ginger are good for nausea, like ginger ale and ginger hard candy Make sure it says made with real ginger! ?Sucking on sour candy like lemon heads is also good for nausea ?If your prenatal vitamins make you nauseated, take them at night so you will sleep through the nausea ?Sea Bands ?If you feel like you need medicine for the nausea & vomiting please let us know ?If you are unable to keep any fluids or food down please let us know ? ? Constipation ?Drink plenty of fluid, preferably water, throughout the day ?Eat foods high in fiber such as fruits, vegetables, and grains ?Exercise, such as walking, is a good way to keep your bowels regular ?Drink warm fluids, especially warm prune juice, or decaf coffee ?Eat a 1/2 cup of real oatmeal (not instant), 1/2 cup applesauce, and 1/2-1 cup warm prune juice every day ?If needed, you may take Colace (docusate sodium) stool softener  once or twice a day to help keep the stool soft.  ?If you still are having problems with constipation, you may take Miralax once daily as needed to help keep your bowels regular.  ? ?Home Blood Pressure Monitoring for Patients  ? ?Your provider has recommended that you check your blood pressure (BP) at least once a week at home. If you do not have a blood pressure cuff at home, one will be provided for you. Contact your provider if you have not received your monitor within 1 week.  ? ?Helpful Tips for Accurate Home Blood Pressure Checks  ?Don't smoke, exercise, or drink caffeine 30 minutes before checking your BP ?Use the restroom before checking your BP (a full bladder can raise your pressure) ?Relax in a comfortable upright chair ?Feet on the ground ?Left arm resting comfortably on a flat surface at the level of your heart ?Legs uncrossed ?Back supported ?Sit quietly and don't talk ?Place the cuff on your bare arm ?Adjust snuggly, so that only two fingertips can fit between your skin and the top of the cuff ?Check 2 readings separated by at least one minute ?Keep a log of your BP readings ?For a visual, please reference this diagram: http://ccnc.care/bpdiagram ? ?Provider Name: [facility]     Phone: [phone] ? ?Zone 1: ALL CLEAR  ?Continue to monitor your symptoms:  ?BP reading is less than 140 (top number) or less than 90 (bottom  number)  ?No right upper stomach pain ?No headaches or seeing spots ?No feeling nauseated or throwing up ?No swelling in face and hands ? ?Zone 2: CAUTION ?Call your doctor's office for any of the following:  ?BP reading is greater than 140 (top number) or greater than 90 (bottom number)  ?Stomach pain under your ribs in the middle or right side ?Headaches or seeing spots ?Feeling nauseated or throwing up ?Swelling in face and hands ? ?Zone 3: EMERGENCY  ?Seek immediate medical care if you have any of the following:  ?BP reading is greater than160 (top number) or greater than  110 (bottom number) ?Severe headaches not improving with Tylenol ?Serious difficulty catching your breath ?Any worsening symptoms from Zone 2  ? ? First Trimester of Pregnancy ?The first trimester of pregnancy is from week 1 until the end of week 12 (months 1 through 3). A week after a sperm fertilizes an egg, the egg will implant on the wall of the uterus. This embryo will begin to develop into a baby. Genes from you and your partner are forming the baby. The female genes determine whether the baby is a boy or a girl. At 6-8 weeks, the eyes and face are formed, and the heartbeat can be seen on ultrasound. At the end of 12 weeks, all the baby's organs are formed.  ?Now that you are pregnant, you will want to do everything you can to have a healthy baby. Two of the most important things are to get good prenatal care and to follow your health care provider's instructions. Prenatal care is all the medical care you receive before the baby's birth. This care will help prevent, find, and treat any problems during the pregnancy and childbirth. ?BODY CHANGES ?Your body goes through many changes during pregnancy. The changes vary from woman to woman.  ?You may gain or lose a couple of pounds at first. ?You may feel sick to your stomach (nauseous) and throw up (vomit). If the vomiting is uncontrollable, call your health care provider. ?You may tire easily. ?You may develop headaches that can be relieved by medicines approved by your health care provider. ?You may urinate more often. Painful urination may mean you have a bladder infection. ?You may develop heartburn as a result of your pregnancy. ?You may develop constipation because certain hormones are causing the muscles that push waste through your intestines to slow down. ?You may develop hemorrhoids or swollen, bulging veins (varicose veins). ?Your breasts may begin to grow larger and become tender. Your nipples may stick out more, and the tissue that surrounds them  (areola) may become darker. ?Your gums may bleed and may be sensitive to brushing and flossing. ?Dark spots or blotches (chloasma, mask of pregnancy) may develop on your face. This will likely fade after the baby is born. ?Your menstrual periods will stop. ?You may have a loss of appetite. ?You may develop cravings for certain kinds of food. ?You may have changes in your emotions from day to day, such as being excited to be pregnant or being concerned that something may go wrong with the pregnancy and baby. ?You may have more vivid and strange dreams. ?You may have changes in your hair. These can include thickening of your hair, rapid growth, and changes in texture. Some women also have hair loss during or after pregnancy, or hair that feels dry or thin. Your hair will most likely return to normal after your baby is born. ?WHAT TO EXPECT AT YOUR PRENATAL  VISITS ?During a routine prenatal visit: ?You will be weighed to make sure you and the baby are growing normally. ?Your blood pressure will be taken. ?Your abdomen will be measured to track your baby's growth. ?The fetal heartbeat will be listened to starting around week 10 or 12 of your pregnancy. ?Test results from any previous visits will be discussed. ?Your health care provider may ask you: ?How you are feeling. ?If you are feeling the baby move. ?If you have had any abnormal symptoms, such as leaking fluid, bleeding, severe headaches, or abdominal cramping. ?If you have any questions. ?Other tests that may be performed during your first trimester include: ?Blood tests to find your blood type and to check for the presence of any previous infections. They will also be used to check for low iron levels (anemia) and Rh antibodies. Later in the pregnancy, blood tests for diabetes will be done along with other tests if problems develop. ?Urine tests to check for infections, diabetes, or protein in the urine. ?An ultrasound to confirm the proper growth and development  of the baby. ?An amniocentesis to check for possible genetic problems. ?Fetal screens for spina bifida and Down syndrome. ?You may need other tests to make sure you and the baby are doing well. ?HOME CARE

## 2021-07-19 LAB — INTEGRATED 1

## 2021-07-20 LAB — INTEGRATED 1
Crown Rump Length: 51.3 mm
Gest. Age on Collection Date: 11.6 wk
Maternal Age at EDD: 22.9 a
Nuchal Translucency (NT): 1.1 mm
Number of Fetuses: 1
PAPP-A Value: 1069.1 ng/mL
Weight: 130 [lb_av]

## 2021-07-20 LAB — URINE CULTURE

## 2021-07-20 LAB — CYTOLOGY - PAP
Chlamydia: NEGATIVE
Comment: NEGATIVE
Comment: NORMAL
Diagnosis: NEGATIVE
Neisseria Gonorrhea: NEGATIVE

## 2021-07-20 LAB — CBC/D/PLT+RPR+RH+ABO+RUBIGG...
Antibody Screen: NEGATIVE
Basophils Absolute: 0 10*3/uL (ref 0.0–0.2)
Basos: 0 %
EOS (ABSOLUTE): 0 10*3/uL (ref 0.0–0.4)
Eos: 0 %
HCV Ab: NONREACTIVE
HIV Screen 4th Generation wRfx: NONREACTIVE
Hematocrit: 32.3 % — ABNORMAL LOW (ref 34.0–46.6)
Hemoglobin: 11 g/dL — ABNORMAL LOW (ref 11.1–15.9)
Hepatitis B Surface Ag: NEGATIVE
Immature Grans (Abs): 0 10*3/uL (ref 0.0–0.1)
Immature Granulocytes: 0 %
Lymphocytes Absolute: 1.5 10*3/uL (ref 0.7–3.1)
Lymphs: 21 %
MCH: 28.7 pg (ref 26.6–33.0)
MCHC: 34.1 g/dL (ref 31.5–35.7)
MCV: 84 fL (ref 79–97)
Monocytes Absolute: 0.6 10*3/uL (ref 0.1–0.9)
Monocytes: 8 %
Neutrophils Absolute: 5 10*3/uL (ref 1.4–7.0)
Neutrophils: 71 %
Platelets: 327 10*3/uL (ref 150–450)
RBC: 3.83 x10E6/uL (ref 3.77–5.28)
RDW: 14.3 % (ref 11.7–15.4)
RPR Ser Ql: NONREACTIVE
Rh Factor: POSITIVE
Rubella Antibodies, IGG: 1.23 {index}
WBC: 7.2 10*3/uL (ref 3.4–10.8)

## 2021-07-20 LAB — HCV INTERPRETATION

## 2021-08-15 ENCOUNTER — Ambulatory Visit (INDEPENDENT_AMBULATORY_CARE_PROVIDER_SITE_OTHER): Payer: 59 | Admitting: Women's Health

## 2021-08-15 ENCOUNTER — Encounter: Payer: Self-pay | Admitting: Women's Health

## 2021-08-15 VITALS — BP 119/66 | HR 94 | Wt 135.0 lb

## 2021-08-15 DIAGNOSIS — Z1379 Encounter for other screening for genetic and chromosomal anomalies: Secondary | ICD-10-CM

## 2021-08-15 DIAGNOSIS — Z3402 Encounter for supervision of normal first pregnancy, second trimester: Secondary | ICD-10-CM

## 2021-08-15 DIAGNOSIS — Z363 Encounter for antenatal screening for malformations: Secondary | ICD-10-CM

## 2021-08-15 NOTE — Patient Instructions (Signed)
Lequita Halt, thank you for choosing our office today! We appreciate the opportunity to meet your healthcare needs. You may receive a short survey by mail, e-mail, or through [employer]. If you are happy with your care we would appreciate if you could take just a few minutes to complete the survey questions. We read all of your comments and take your feedback very seriously. Thank you again for choosing our office.  ?Center for [employer] Team at [facility] ?[facility] ?([address] [place], [place] [address]) ?Entrance C, located off of [address] ?Free 24/7 valet parking  ?Go to [email or web address] to register for FREE online childbirth classes ? ?Call the office [phone]) or go to [facility] if: ?You begin to severe cramping ?Your water breaks.  Sometimes it is a big gush of fluid, sometimes it is just a trickle that keeps getting your panties wet or running down your legs ?You have vaginal bleeding.  It is normal to have a small amount of spotting if your cervix was checked.  ? ?[place] Pediatricians/Family Doctors ?[facility]): [address], [phone]           ?[facility]: [address], [phone]                ?[facility]): [address], [phone] (call to ask if accepting patients) ?[facility] Department: [address], [place], [phone]   ? ?[place] Pediatricians/Family Doctors ?[facility]): [address], [phone] ?[facility]: [address] [place], [phone] ?[facility]: [address], [phone] ? ?[employer] Family Doctors  ?[facility]): [phone] ?[facility]: [address], [phone]  ? ?[facility] Family Doctors ?[facility]: [address], [phone] ? ?[facility]  ?[facility]: [phone], [phone] ? ?Home Blood Pressure Monitoring for Patients  ? ?Your provider has recommended that you check your blood pressure (BP) at least once a week at home. If you do not have a blood pressure cuff at home, one will be provided for you. Contact your provider if you have not received your monitor within 1 week.  ? ?Helpful Tips for Accurate Home Blood Pressure Checks  ?Don't smoke, exercise, or drink caffeine 30 minutes before checking your BP ?Use the restroom before checking your BP (a full bladder can raise your pressure) ?Relax in a comfortable upright chair ?Feet on the ground ?Left arm resting comfortably on a flat surface at the level of your heart ?Legs uncrossed ?Back supported ?Sit quietly and don't talk ?Place the cuff on your bare arm ?Adjust snuggly, so that only two fingertips can fit between your skin and the top of the cuff ?Check 2 readings separated by at least one minute ?Keep a log of your BP readings ?For a visual, please reference this diagram: http://ccnc.care/bpdiagram ? ?Provider Name: [facility]     Phone: [phone] ? ?Zone 1: ALL CLEAR  ?Continue to monitor your symptoms:  ?BP reading is less than 140 (top number) or less than 90 (bottom number)  ?No right upper stomach pain ?No headaches or seeing spots ?No feeling nauseated or throwing up ?No swelling in face and hands ? ?Zone 2: CAUTION ?Call your doctor's office for any of the following:  ?BP reading is greater than 140 (top number) or greater than  90 (bottom number)  ?Stomach pain under your ribs in the middle or right side ?Headaches or seeing spots ?Feeling nauseated or throwing up ?Swelling in face and hands ? ?Zone 3: EMERGENCY  ?Seek immediate medical care if you have any of the following:  ?BP reading is greater than160 (top number) or greater than 110 (bottom number) ?Severe headaches not improving with Tylenol ?Serious difficulty catching your breath ?Any worsening symptoms from  Zone 2  ? ?  ?Second Trimester of Pregnancy ?The second trimester is from week 14 through week 27 (months 4 through 6). The second trimester is often a time when you feel your best. Your body has adjusted to being pregnant, and you begin to feel better physically. Usually, morning sickness has lessened or quit completely, you may have more energy, and you may have an increase in appetite. The second trimester is also a time when the fetus is growing rapidly. At the end of the sixth month, the fetus is about 9 inches long and weighs about 1? pounds. You will likely begin to feel the baby move (quickening) between 16 and 20 weeks of pregnancy. ?Body changes during your second trimester ?Your body continues to go through many changes during your second trimester. The changes vary from woman to woman. ?Your weight will continue to increase. You will notice your lower abdomen bulging out. ?You may begin to get stretch marks on your hips, abdomen, and breasts. ?You may develop headaches that can be relieved by medicines. The medicines should be approved by your health care provider. ?You may urinate more often because the fetus is pressing on your bladder. ?You may develop or continue to have heartburn as a result of your pregnancy. ?You may develop constipation because certain hormones are causing the muscles that push waste through your intestines to slow down. ?You may develop hemorrhoids or swollen, bulging veins (varicose veins). ?You may have back pain. This is caused by: ?Weight gain. ?Pregnancy hormones that are relaxing the joints in your pelvis. ?A shift in weight and the muscles that support your balance. ?Your breasts will continue to grow and they will continue to become tender. ?Your gums may bleed and may be sensitive to brushing and flossing. ?Dark spots or blotches (chloasma, mask of pregnancy) may develop on your face. This will likely fade after the baby is born. ?A dark line from your belly button to  the pubic area (linea nigra) may appear. This will likely fade after the baby is born. ?You may have changes in your hair. These can include thickening of your hair, rapid growth, and changes in texture. Some women also have hair loss during or after pregnancy, or hair that feels dry or thin. Your hair will most likely return to normal after your baby is born. ? ?What to expect at prenatal visits ?During a routine prenatal visit: ?You will be weighed to make sure you and the fetus are growing normally. ?Your blood pressure will be taken. ?Your abdomen will be measured to track your baby's growth. ?The fetal heartbeat will be listened to. ?Any test results from the previous visit will be discussed. ? ?Your health care provider may ask you: ?How you are feeling. ?If you are feeling the baby move. ?If you have had any abnormal symptoms, such as leaking fluid, bleeding, severe headaches, or abdominal cramping. ?If you are using any tobacco products, including cigarettes, chewing tobacco, and electronic cigarettes. ?If you have any questions. ? ?Other tests that may be performed during  your second trimester include: ?Blood tests that check for: ?Low iron levels (anemia). ?High blood sugar that affects pregnant women (gestational diabetes) between 59 and 28 weeks. ?Rh antibodies. This is to check for a protein on red blood cells (Rh factor). ?Urine tests to check for infections, diabetes, or protein in the urine. ?An ultrasound to confirm the proper growth and development of the baby. ?An amniocentesis to check for possible genetic problems. ?Fetal screens for spina bifida and Down syndrome. ?HIV (human immunodeficiency virus) testing. Routine prenatal testing includes screening for HIV, unless you choose not to have this test. ? ?Follow these instructions at home: ?Medicines ?Follow your health care provider's instructions regarding medicine use. Specific medicines may be either safe or unsafe to take during  pregnancy. ?Take a prenatal vitamin that contains at least 600 micrograms (mcg) of folic acid. ?If you develop constipation, try taking a stool softener if your health care provider approves. ?Eating and drinking ?Eat

## 2021-08-15 NOTE — Progress Notes (Signed)
? ? ?LOW-RISK PREGNANCY VISIT ?Patient name: Nicole Mckinney MRN [id number]  Date of birth: [birth year] ?Chief Complaint:   ?Routine Prenatal Visit ? ?History of Present Illness:   ?Nicole Mckinney is a 21 y.o. G1P0000 female at [redacted]w[redacted]d with an Estimated Date of Delivery: 01/27/22 being seen today for ongoing management of a low-risk pregnancy.  ? ?Today she reports  sinus infection and Rt ear pain, getting better . Contractions: Not present. Vag. Bleeding: None.  Movement: Absent. denies leaking of fluid. ? ? ?  07/18/2021  ?  2:01 PM 08/01/2017  ?  8:46 AM  ?Depression screen PHQ 2/9  ?Decreased Interest 0 0  ?Down, Depressed, Hopeless 0 0  ?PHQ - 2 Score 0 0  ?Altered sleeping 0   ?Tired, decreased energy 1   ?Change in appetite 0   ?Feeling bad or failure about yourself  0   ?Trouble concentrating 0   ?Moving slowly or fidgety/restless 0   ?Suicidal thoughts 0   ?PHQ-9 Score 1   ? ?  ? ?  07/18/2021  ?  2:01 PM  ?GAD 7 : Generalized Anxiety Score  ?Nervous, Anxious, on Edge 1  ?Control/stop worrying 1  ?Worry too much - different things 1  ?Trouble relaxing 0  ?Restless 0  ?Easily annoyed or irritable 0  ?Afraid - awful might happen 0  ?Total GAD 7 Score 3  ? ? ?  ?Review of Systems:   ?Pertinent items are noted in HPI ?Denies abnormal vaginal discharge w/ itching/odor/irritation, headaches, visual changes, shortness of breath, chest pain, abdominal pain, severe nausea/vomiting, or problems with urination or bowel movements unless otherwise stated above. ?Pertinent History Reviewed:  ?Reviewed past medical,surgical, social, obstetrical and family history.  ?Reviewed problem list, medications and allergies. ?Physical Assessment:  ? ?Vitals:  ? 08/15/21 1606  ?BP: 119/66  ?Pulse: 94  ?Weight: 135 lb (61.2 kg)  ?Body mass index is 22.47 kg/m?. ?  ?     Physical Examination:  ? General appearance: Well appearing, and in no distress ? Mental status: Alert, oriented to person, place, and time ? Skin: Warm & dry ? Bilateral  ears: TM normal, no erythema, no bulging ? Sinuses: slightly tender frontal and ethmoid sinuses ? Neck: small Rt cervical lymph node ? Cardiovascular: Normal heart rate noted ? Respiratory: Normal respiratory effort, no distress ? Abdomen: Soft, gravid, nontender ? Pelvic: Cervical exam deferred        ? Extremities: Edema: None ? ?Fetal Status: Fetal Heart Rate (bpm): 148   Movement: Absent   ? ?Chaperone: N/A   ?No results found for this or any previous visit (from the past 24 hour(s)).  ?Assessment & Plan:  ?1) Low-risk pregnancy G1P0000 at [redacted]w[redacted]d with an Estimated Date of Delivery: 01/27/22  ? ?2) Resolving sinus infection/possible Rt ear infection, sx improving, no obvious ear infection or reason for antibiotics today ?  ?Meds: No orders of the defined types were placed in this encounter. ? ?Labs/procedures today: 2nd IT ? ?Plan:  Continue routine obstetrical care  ?Next visit: prefers will be in person for anatomy u/s    ? ?Reviewed: Preterm labor symptoms and general obstetric precautions including but not limited to vaginal bleeding, contractions, leaking of fluid and fetal movement were reviewed in detail with the patient.  All questions were answered. Does not have home bp cuff. Office bp cuff given: yes. Check bp weekly, let us know if consistently >140 and/or >90. ? ?Follow-up: Return for As scheduled. ? ?Future  Appointments  ?Date Time Provider Department Center  ?09/05/2021  2:15 PM [facility] [place] [facility] None  ?09/05/2021  3:10 PM Cheral Marker, CNM [facility]  ? ? ?Orders Placed This Encounter  ?Procedures  ? US OB Comp + 14 Wk  ? INTEGRATED 2  ? ?Cheral Marker CNM, WHNP-BC ?08/15/2021 ?4:42 PM  ?

## 2021-08-17 LAB — INTEGRATED 2
AFP MoM: 1.03
Alpha-Fetoprotein: 34.7 ng/mL
Crown Rump Length: 51.3 mm
DIA MoM: 1.15
DIA Value: 200.1 pg/mL
Estriol, Unconjugated: 0.97 ng/mL
Gest. Age on Collection Date: 11.6 weeks
Gestational Age: 15.6 weeks
Maternal Age at EDD: 22.9 yr
Nuchal Translucency (NT): 1.1 mm
Nuchal Translucency MoM: 0.95
Number of Fetuses: 1
PAPP-A MoM: 1.29
PAPP-A Value: 1069.1 ng/mL
Test Results:: NEGATIVE
Weight: 130 [lb_av]
Weight: 130 [lb_av]
hCG MoM: 1.13
hCG Value: 51.6 IU/mL
uE3 MoM: 1.12

## 2021-09-05 ENCOUNTER — Ambulatory Visit (INDEPENDENT_AMBULATORY_CARE_PROVIDER_SITE_OTHER): Payer: 59

## 2021-09-05 ENCOUNTER — Ambulatory Visit (INDEPENDENT_AMBULATORY_CARE_PROVIDER_SITE_OTHER): Payer: 59 | Admitting: Women's Health

## 2021-09-05 ENCOUNTER — Encounter: Payer: Self-pay | Admitting: Women's Health

## 2021-09-05 VITALS — BP 120/73 | HR 98 | Wt 139.0 lb

## 2021-09-05 DIAGNOSIS — Z3402 Encounter for supervision of normal first pregnancy, second trimester: Secondary | ICD-10-CM | POA: Diagnosis not present

## 2021-09-05 DIAGNOSIS — Z3A19 19 weeks gestation of pregnancy: Secondary | ICD-10-CM

## 2021-09-05 DIAGNOSIS — Z363 Encounter for antenatal screening for malformations: Secondary | ICD-10-CM | POA: Diagnosis not present

## 2021-09-05 NOTE — Progress Notes (Signed)
LOW-RISK PREGNANCY VISIT Patient name: Nicole Mckinney MRN [id number]  Date of birth: 09/23/2000 Chief Complaint:   Routine Prenatal Visit  History of Present Illness:   Nicole Mckinney is a 21 y.o. G75P0000 female at [redacted]w[redacted]d with an Estimated Date of Delivery: 01/27/22 being seen today for ongoing management of a low-risk pregnancy.   Today she reports  pulled muscle in back the other day . Contractions: Not present. Vag. Bleeding: None.  Movement: Present. denies leaking of fluid.     07/18/2021    2:01 PM 08/01/2017    8:46 AM  Depression screen PHQ 2/9  Decreased Interest 0 0  Down, Depressed, Hopeless 0 0  PHQ - 2 Score 0 0  Altered sleeping 0   Tired, decreased energy 1   Change in appetite 0   Feeling bad or failure about yourself  0   Trouble concentrating 0   Moving slowly or fidgety/restless 0   Suicidal thoughts 0   PHQ-9 Score 1         07/18/2021    2:01 PM  GAD 7 : Generalized Anxiety Score  Nervous, Anxious, on Edge 1  Control/stop worrying 1  Worry too much - different things 1  Trouble relaxing 0  Restless 0  Easily annoyed or irritable 0  Afraid - awful might happen 0  Total GAD 7 Score 3      Review of Systems:   Pertinent items are noted in HPI Denies abnormal vaginal discharge w/ itching/odor/irritation, headaches, visual changes, shortness of breath, chest pain, abdominal pain, severe nausea/vomiting, or problems with urination or bowel movements unless otherwise stated above. Pertinent History Reviewed:  Reviewed past medical,surgical, social, obstetrical and family history.  Reviewed problem list, medications and allergies. Physical Assessment:   Vitals:   09/05/21 1506  BP: 120/73  Pulse: 98  Weight: 139 lb (63 kg)  Body mass index is 23.13 kg/m.        Physical Examination:   General appearance: Well appearing, and in no distress  Mental status: Alert, oriented to person, place, and time  Skin: Warm & dry  Cardiovascular: Normal  heart rate noted  Respiratory: Normal respiratory effort, no distress  Abdomen: Soft, gravid, nontender  Pelvic: Cervical exam deferred         Extremities: Edema: None  Fetal Status:     Movement: Present  [place] 19+3 wks,cephalic,posterior placenta gr 0,CX  4.3 cm,FHR 157 bpm,LVEICF 1.7 mm,normal ovaries,SVP of fluid 3.1 cm,EFW 277 g 30%,anatomy complete,no obvious abnormalities   Chaperone: N/A   No results found for this or any previous visit (from the past 24 hour(s)).  Assessment & Plan:  1) Low-risk pregnancy G1P0000 at [redacted]w[redacted]d with an Estimated Date of Delivery: 01/27/22   2) Isolated LVEICF, neg Panorama, no further testing needed, discussed and gave printed info   Meds: No orders of the defined types were placed in this encounter.  Labs/procedures today: U/S  Plan:  Continue routine obstetrical care  Next visit: prefers in person    Reviewed: Preterm labor symptoms and general obstetric precautions including but not limited to vaginal bleeding, contractions, leaking of fluid and fetal movement were reviewed in detail with the patient.  All questions were answered. Does have home bp cuff. Office bp cuff given: not applicable. Check bp weekly, let us know if consistently >140 and/or >90.  Follow-up: Return in about 4 weeks (around 10/03/2021) for LROB, CNM, in person.  Future Appointments  Date Time Provider Department Center  10/03/2021 11:50 AM Roma Schanz, CNM [facility]    No orders of the defined types were placed in this encounter.  [place], [facility] 09/05/2021 3:53 PM

## 2021-09-05 NOTE — Patient Instructions (Signed)
[place], thank you for choosing our office today! We appreciate the opportunity to meet your healthcare needs. You may receive a short survey by mail, e-mail, or through MyChart. If you are happy with your care we would appreciate if you could take just a few minutes to complete the survey questions. We read all of your comments and take your feedback very seriously. Thank you again for choosing our office.  [facility] Team at [facility] ([address] [place], [place] [address]) Entrance C, located off of [address] Free 24/7 valet parking  Go to [email or web address] to register for FREE online childbirth classes  Call the office ([phone]) or go to [facility] if: You begin to severe cramping Your water breaks.  Sometimes it is a big gush of fluid, sometimes it is just a trickle that keeps getting your panties wet or running down your legs You have vaginal bleeding.  It is normal to have a small amount of spotting if your cervix was checked.   [place] [facility]/Family Doctors [facility] ([facility]): [address], [phone]           [facility]: [address], [phone]                [facility] ([facility]): [address], [phone] (call to ask if accepting patients) [facility]: [address], [place], [phone]    [place] [facility]/Family Doctors [facility] ([facility]): [address], [phone] [facility]: [address], [phone] [facility]: [address], [phone]  [place] [facility]  [facility] ([facility]): [phone] [facility]: [address], [phone]   [place] [facility]: [address], [phone]  [place] [facility]  [facility]: [address], [phone]  Home Blood Pressure Monitoring for Patients   Your provider has recommended that you check your blood pressure (BP) at least once a week at home. If you do not have a blood pressure cuff at home, one will be provided for you. Contact your provider if you have not received your monitor within 1 week.   Helpful Tips for Accurate Home Blood Pressure Checks  Don't smoke, exercise, or drink caffeine 30 minutes before checking your BP Use the restroom before checking your BP (a full bladder can raise your pressure) Relax in a comfortable upright chair Feet on the ground Left arm resting comfortably on a flat surface at the level of your heart Legs uncrossed Back supported Sit quietly and don't talk Place the cuff on your bare arm Adjust snuggly, so that only two fingertips can fit between your skin and the top of the cuff Check 2 readings separated by at least one minute Keep a log of your BP readings For a visual, please reference this diagram: http://[facility].care/bpdiagram  Provider Name: [facility]     Phone: [phone]  Zone 1: ALL CLEAR  Continue to monitor your symptoms:  BP reading is less than 140 (top number) or less than 90 (bottom number)  No right upper stomach pain No headaches or seeing spots No feeling nauseated or throwing up No swelling in face and hands  Zone 2: CAUTION Call your doctor's office for any of the following:  BP reading is greater than 140 (top number) or greater than   90 (bottom number)  Stomach pain under your ribs in the middle or right side Headaches or seeing spots Feeling nauseated or throwing up Swelling in face and hands  Zone 3: EMERGENCY  Seek immediate medical care if you have any of the following:  BP reading is greater than160 (top number) or greater than 110 (bottom number) Severe headaches not improving with Tylenol Serious difficulty catching your breath Any worsening symptoms from  Zone 2     Second Trimester of Pregnancy The second trimester is from week 14 through week 27 (months 4 through 6). The second trimester is often a time when you feel your best. Your body has adjusted to being pregnant, and you begin to feel better physically. Usually, morning sickness has lessened or quit completely, you may have more energy, and you may have an increase in appetite. The second trimester is also a time when the fetus is growing rapidly. At the end of the sixth month, the fetus is about 9 inches long and weighs about 1 pounds. You will likely begin to feel the baby move (quickening) between 16 and 20 weeks of pregnancy. Body changes during your second trimester Your body continues to go through many changes during your second trimester. The changes vary from woman to woman. Your weight will continue to increase. You will notice your lower abdomen bulging out. You may begin to get stretch marks on your hips, abdomen, and breasts. You may develop headaches that can be relieved by medicines. The medicines should be approved by your health care provider. You may urinate more often because the fetus is pressing on your bladder. You may develop or continue to have heartburn as a result of your pregnancy. You may develop constipation because certain hormones are causing the muscles that push waste through your intestines to slow down. You may develop hemorrhoids or swollen, bulging veins (varicose veins). You may have back pain. This is caused by: Weight gain. Pregnancy hormones that are relaxing the joints in your pelvis. A shift in weight and the muscles that support your balance. Your breasts will continue to grow and they will continue to become tender. Your gums may bleed and may be sensitive to brushing and flossing. Dark spots or blotches (chloasma, mask of pregnancy) may develop on your face. This will likely fade after the baby is born. A dark line from your belly button to  the pubic area (linea nigra) may appear. This will likely fade after the baby is born. You may have changes in your hair. These can include thickening of your hair, rapid growth, and changes in texture. Some women also have hair loss during or after pregnancy, or hair that feels dry or thin. Your hair will most likely return to normal after your baby is born.  What to expect at prenatal visits During a routine prenatal visit: You will be weighed to make sure you and the fetus are growing normally. Your blood pressure will be taken. Your abdomen will be measured to track your baby's growth. The fetal heartbeat will be listened to. Any test results from the previous visit will be discussed.  Your health care provider may ask you: How you are feeling. If you are feeling the baby move. If you have had any abnormal symptoms, such as leaking fluid, bleeding, severe headaches, or abdominal cramping. If you are using any tobacco products, including cigarettes, chewing tobacco, and electronic cigarettes. If you have any questions.  Other tests that may be performed during   your second trimester include: Blood tests that check for: Low iron levels (anemia). High blood sugar that affects pregnant women (gestational diabetes) between 24 and 28 weeks. Rh antibodies. This is to check for a protein on red blood cells (Rh factor). Urine tests to check for infections, diabetes, or protein in the urine. An ultrasound to confirm the proper growth and development of the baby. An amniocentesis to check for possible genetic problems. Fetal screens for spina bifida and Down syndrome. HIV (human immunodeficiency virus) testing. Routine prenatal testing includes screening for HIV, unless you choose not to have this test.  Follow these instructions at home: Medicines Follow your health care provider's instructions regarding medicine use. Specific medicines may be either safe or unsafe to take during  pregnancy. Take a prenatal vitamin that contains at least 600 micrograms (mcg) of folic acid. If you develop constipation, try taking a stool softener if your health care provider approves. Eating and drinking Eat a balanced diet that includes fresh fruits and vegetables, whole grains, good sources of protein such as meat, eggs, or tofu, and low-fat dairy. Your health care provider will help you determine the amount of weight gain that is right for you. Avoid raw meat and uncooked cheese. These carry germs that can cause birth defects in the baby. If you have low calcium intake from food, talk to your health care provider about whether you should take a daily calcium supplement. Limit foods that are high in fat and processed sugars, such as fried and sweet foods. To prevent constipation: Drink enough fluid to keep your urine clear or pale yellow. Eat foods that are high in fiber, such as fresh fruits and vegetables, whole grains, and beans. Activity Exercise only as directed by your health care provider. Most women can continue their usual exercise routine during pregnancy. Try to exercise for 30 minutes at least 5 days a week. Stop exercising if you experience uterine contractions. Avoid heavy lifting, wear low heel shoes, and practice good posture. A sexual relationship may be continued unless your health care provider directs you otherwise. Relieving pain and discomfort Wear a good support bra to prevent discomfort from breast tenderness. Take warm sitz baths to soothe any pain or discomfort caused by hemorrhoids. Use hemorrhoid cream if your health care provider approves. Rest with your legs elevated if you have leg cramps or low back pain. If you develop varicose veins, wear support hose. Elevate your feet for 15 minutes, 3-4 times a day. Limit salt in your diet. Prenatal Care Write down your questions. Take them to your prenatal visits. Keep all your prenatal visits as told by your health  care provider. This is important. Safety Wear your seat belt at all times when driving. Make a list of emergency phone numbers, including numbers for family, friends, the hospital, and police and fire departments. General instructions Ask your health care provider for a referral to a local prenatal education class. Begin classes no later than the beginning of month 6 of your pregnancy. Ask for help if you have counseling or nutritional needs during pregnancy. Your health care provider can offer advice or refer you to specialists for help with various needs. Do not use hot tubs, steam rooms, or saunas. Do not douche or use tampons or scented sanitary pads. Do not cross your legs for long periods of time. Avoid cat litter boxes and soil used by cats. These carry germs that can cause birth defects in the baby and possibly loss of the   fetus by miscarriage or stillbirth. Avoid all smoking, herbs, alcohol, and unprescribed drugs. Chemicals in these products can affect the formation and growth of the baby. Do not use any products that contain nicotine or tobacco, such as cigarettes and e-cigarettes. If you need help quitting, ask your health care provider. Visit your dentist if you have not gone yet during your pregnancy. Use a soft toothbrush to brush your teeth and be gentle when you floss. Contact a health care provider if: You have dizziness. You have mild pelvic cramps, pelvic pressure, or nagging pain in the abdominal area. You have persistent nausea, vomiting, or diarrhea. You have a bad smelling vaginal discharge. You have pain when you urinate. Get help right away if: You have a fever. You are leaking fluid from your vagina. You have spotting or bleeding from your vagina. You have severe abdominal cramping or pain. You have rapid weight gain or weight loss. You have shortness of breath with chest pain. You notice sudden or extreme swelling of your face, hands, ankles, feet, or legs. You  have not felt your baby move in over an hour. You have severe headaches that do not go away when you take medicine. You have vision changes. Summary The second trimester is from week 14 through week 27 (months 4 through 6). It is also a time when the fetus is growing rapidly. Your body goes through many changes during pregnancy. The changes vary from woman to woman. Avoid all smoking, herbs, alcohol, and unprescribed drugs. These chemicals affect the formation and growth your baby. Do not use any tobacco products, such as cigarettes, chewing tobacco, and e-cigarettes. If you need help quitting, ask your health care provider. Contact your health care provider if you have any questions. Keep all prenatal visits as told by your health care provider. This is important. This information is not intended to replace advice given to you by your health care provider. Make sure you discuss any questions you have with your health care provider. Document Released: 03/14/2001 Document Revised: 08/26/2015 Document Reviewed: 05/21/2012 Elsevier Interactive Patient Education  2017 [employer]  

## 2021-09-05 NOTE — Progress Notes (Signed)
[place] 19+3 wks,cephalic,posterior placenta gr 0,CX  4.3 cm,FHR 157 bpm,LVEICF 1.7 mm,normal ovaries,SVP of fluid 3.1 cm,EFW 277 g 30%,anatomy complete,no obvious abnormalities

## 2021-10-03 ENCOUNTER — Encounter: Payer: Self-pay | Admitting: Women's Health

## 2021-10-03 ENCOUNTER — Ambulatory Visit (INDEPENDENT_AMBULATORY_CARE_PROVIDER_SITE_OTHER): Payer: 59 | Admitting: Women's Health

## 2021-10-03 VITALS — BP 130/73 | HR 90 | Wt 148.4 lb

## 2021-10-03 DIAGNOSIS — Z3402 Encounter for supervision of normal first pregnancy, second trimester: Secondary | ICD-10-CM

## 2021-10-03 NOTE — Patient Instructions (Signed)
Nicole Mckinney, thank you for choosing our office today! We appreciate the opportunity to meet your healthcare needs. You may receive a short survey by mail, e-mail, or through [employer]. If you are happy with your care we would appreciate if you could take just a few minutes to complete the survey questions. We read all of your comments and take your feedback very seriously. Thank you again for choosing our office.  Center for [employer] Team at [facility]  [facility] at [facility] ([address] [place], [place] [address]) Entrance C, located off of [address] Free 24/7 valet parking   You will have your sugar test next visit.  Please do not eat or drink anything after midnight the night before you come, not even water.  You will be here for at least two hours.  Please make an appointment online for the bloodwork at [email or web address] for 8:00am (or as close to this as possible). Make sure you select the [facility] service center.   CLASSES: Go to [email or web address] to register for classes (childbirth, breastfeeding, waterbirth, infant CPR, daddy bootcamp, etc.)  Call the office [phone]) or go to [facility] if: You begin to have strong, frequent contractions Your water breaks.  Sometimes it is a big gush of fluid, sometimes it is just a trickle that keeps getting your panties wet or running down your legs You have vaginal bleeding.  It is normal to have a small amount of spotting if your cervix was checked.  You don't feel your baby moving like normal.  If you don't, get you something to eat and drink and lay down and focus on feeling your baby move.   If your baby is still not moving like normal, you should call the office or go to [facility].  Call the office [phone]) or go to [facility] hospital for these signs of pre-eclampsia: Severe headache that does not go away with Tylenol Visual changes- seeing spots, double, blurred vision Pain under your right breast or upper  abdomen that does not go away with Tums or heartburn medicine Nausea and/or vomiting Severe swelling in your hands, feet, and face    [facility] Pediatricians/Family Doctors [facility]): [address] Colette Ribas, [phone]           [facility]: [address], [phone]                [facility]): [address], [phone] (call to ask if accepting patients) [facility] Department: [address] [place], [phone]    [facility] Pediatricians/Family Doctors [facility]): [address], [phone] [facility]: [address] [place], [phone] [facility] [place]: [address], [phone]  [facility] Doctors  [facility]): [phone] [facility]: [address], [phone]   [facility] Doctors [facility]: [address] [phone]  [facility] Doctors  [facility]: [phone], [phone]  Home Blood Pressure Monitoring for Patients   Your provider has recommended that you check your blood pressure (BP) at least once a week at home. If you do not have a blood pressure cuff at home, one will be provided for you. Contact your provider if you have not received your monitor within 1 week.   Helpful Tips for Accurate Home Blood Pressure Checks  Don't smoke, exercise, or drink  caffeine 30 minutes before checking your BP Use the restroom before checking your BP (a full bladder can raise your pressure) Relax in a comfortable upright chair Feet on the ground Left arm resting comfortably on a flat surface at the level of your heart Legs uncrossed Back supported Sit quietly and don't talk Place the cuff on your bare arm Adjust snuggly, so that only two fingertips can fit between your skin and the top of the cuff Check 2  readings separated by at least one minute Keep a log of your BP readings For a visual, please reference this diagram: http://ccnc.care/bpdiagram  Provider Name: [facility]     Phone: [phone]  Zone 1: ALL CLEAR  Continue to monitor your symptoms:  BP reading is less than 140 (top number) or less than 90 (bottom number)  No right upper stomach pain No headaches or seeing spots No feeling nauseated or throwing up No swelling in face and hands  Zone 2: CAUTION Call your doctor's office for any of the following:  BP reading is greater than 140 (top number) or greater than 90 (bottom number)  Stomach pain under your ribs in the middle or right side Headaches or seeing spots Feeling nauseated or throwing up Swelling in face and hands  Zone 3: EMERGENCY  Seek immediate medical care if you have any of the following:  BP reading is greater than160 (top number) or greater than 110 (bottom number) Severe headaches not improving with Tylenol Serious difficulty catching your breath Any worsening symptoms from Zone 2   Second Trimester of Pregnancy The second trimester is from week 13 through week 28, months 4 through 6. The second trimester is often a time when you feel your best. Your body has also adjusted to being pregnant, and you begin to feel better physically. Usually, morning sickness has lessened or quit completely, you may have more energy, and you may have an increase in appetite. The second trimester is also a time when the fetus is growing rapidly. At the end of the sixth month, the fetus is about 9 inches long and weighs about 1 pounds. You will likely begin to feel the baby move (quickening) between 18 and 20 weeks of the pregnancy. BODY CHANGES Your body goes through many changes during pregnancy. The changes vary from woman to woman.  Your weight will continue to increase. You will notice your lower abdomen bulging out. You may begin to get stretch marks on your  hips, abdomen, and breasts. You may develop headaches that can be relieved by medicines approved by your health care provider. You may urinate more often because the fetus is pressing on your bladder. You may develop or continue to have heartburn as a result of your pregnancy. You may develop constipation because certain hormones are causing the muscles that push waste through your intestines to slow down. You may develop hemorrhoids or swollen, bulging veins (varicose veins). You may have back pain because of the weight gain and pregnancy hormones relaxing your joints between the bones in your pelvis and as a result of a shift in weight and the muscles that support your balance. Your breasts will continue to grow and be tender. Your gums may bleed and may be sensitive to brushing and flossing. Dark spots or blotches (chloasma, mask of pregnancy) may develop on your face. This will likely fade after the baby is born. A dark line from your belly button to the pubic area (linea nigra) may appear. This  will likely fade after the baby is born. You may have changes in your hair. These can include thickening of your hair, rapid growth, and changes in texture. Some women also have hair loss during or after pregnancy, or hair that feels dry or thin. Your hair will most likely return to normal after your baby is born. WHAT TO EXPECT AT YOUR PRENATAL VISITS During a routine prenatal visit: You will be weighed to make sure you and the fetus are growing normally. Your blood pressure will be taken. Your abdomen will be measured to track your baby's growth. The fetal heartbeat will be listened to. Any test results from the previous visit will be discussed. Your health care provider may ask you: How you are feeling. If you are feeling the baby move. If you have had any abnormal symptoms, such as leaking fluid, bleeding, severe headaches, or abdominal cramping. If you have any questions. Other tests that may  be performed during your second trimester include: Blood tests that check for: Low iron levels (anemia). Gestational diabetes (between 24 and 28 weeks). Rh antibodies. Urine tests to check for infections, diabetes, or protein in the urine. An ultrasound to confirm the proper growth and development of the baby. An amniocentesis to check for possible genetic problems. Fetal screens for spina bifida and Down syndrome. HOME CARE INSTRUCTIONS  Avoid all smoking, herbs, alcohol, and unprescribed drugs. These chemicals affect the formation and growth of the baby. Follow your health care provider's instructions regarding medicine use. There are medicines that are either safe or unsafe to take during pregnancy. Exercise only as directed by your health care provider. Experiencing uterine cramps is a good sign to stop exercising. Continue to eat regular, healthy meals. Wear a good support bra for breast tenderness. Do not use hot tubs, steam rooms, or saunas. Wear your seat belt at all times when driving. Avoid raw meat, uncooked cheese, cat litter boxes, and soil used by cats. These carry germs that can cause birth defects in the baby. Take your prenatal vitamins. Try taking a stool softener (if your health care provider approves) if you develop constipation. Eat more high-fiber foods, such as fresh vegetables or fruit and whole grains. Drink plenty of fluids to keep your urine clear or pale yellow. Take warm sitz baths to soothe any pain or discomfort caused by hemorrhoids. Use hemorrhoid cream if your health care provider approves. If you develop varicose veins, wear support hose. Elevate your feet for 15 minutes, 3-4 times a day. Limit salt in your diet. Avoid heavy lifting, wear low heel shoes, and practice good posture. Rest with your legs elevated if you have leg cramps or low back pain. Visit your dentist if you have not gone yet during your pregnancy. Use a soft toothbrush to brush your teeth  and be gentle when you floss. A sexual relationship may be continued unless your health care provider directs you otherwise. Continue to go to all your prenatal visits as directed by your health care provider. SEEK MEDICAL CARE IF:  You have dizziness. You have mild pelvic cramps, pelvic pressure, or nagging pain in the abdominal area. You have persistent nausea, vomiting, or diarrhea. You have a bad smelling vaginal discharge. You have pain with urination. SEEK IMMEDIATE MEDICAL CARE IF:  You have a fever. You are leaking fluid from your vagina. You have spotting or bleeding from your vagina. You have severe abdominal cramping or pain. You have rapid weight gain or loss. You have shortness of   breath with chest pain. You notice sudden or extreme swelling of your face, hands, ankles, feet, or legs. You have not felt your baby move in over an hour. You have severe headaches that do not go away with medicine. You have vision changes. Document Released: 03/14/2001 Document Revised: 03/25/2013 Document Reviewed: 05/21/2012 [facility] Patient Information 2015 [place], [place]. This information is not intended to replace advice given to you by your health care provider. Make sure you discuss any questions you have with your health care provider.

## 2021-10-03 NOTE — Progress Notes (Signed)
    LOW-RISK PREGNANCY VISIT Patient name: Nicole Mckinney MRN [id number]  Date of birth: 06-06-2000 Chief Complaint:   Routine Prenatal Visit  History of Present Illness:   Nicole Mckinney is a 21 y.o. G84P0000 female at [redacted]w[redacted]d with an Estimated Date of Delivery: 01/27/22 being seen today for ongoing management of a low-risk pregnancy.   Today she reports swelling. Contractions: Not present. Vag. Bleeding: None.  Movement: Present. denies leaking of fluid.     07/18/2021    2:01 PM 08/01/2017    8:46 AM  Depression screen PHQ 2/9  Decreased Interest 0 0  Down, Depressed, Hopeless 0 0  PHQ - 2 Score 0 0  Altered sleeping 0   Tired, decreased energy 1   Change in appetite 0   Feeling bad or failure about yourself  0   Trouble concentrating 0   Moving slowly or fidgety/restless 0   Suicidal thoughts 0   PHQ-9 Score 1         07/18/2021    2:01 PM  GAD 7 : Generalized Anxiety Score  Nervous, Anxious, on Edge 1  Control/stop worrying 1  Worry too much - different things 1  Trouble relaxing 0  Restless 0  Easily annoyed or irritable 0  Afraid - awful might happen 0  Total GAD 7 Score 3      Review of Systems:   Pertinent items are noted in HPI Denies abnormal vaginal discharge w/ itching/odor/irritation, headaches, visual changes, shortness of breath, chest pain, abdominal pain, severe nausea/vomiting, or problems with urination or bowel movements unless otherwise stated above. Pertinent History Reviewed:  Reviewed past medical,surgical, social, obstetrical and family history.  Reviewed problem list, medications and allergies. Physical Assessment:   Vitals:   10/03/21 1148  BP: 130/73  Pulse: 90  Weight: 148 lb 6.4 oz (67.3 kg)  Body mass index is 24.7 kg/m.        Physical Examination:   General appearance: Well appearing, and in no distress  Mental status: Alert, oriented to person, place, and time  Skin: Warm & dry  Cardiovascular: Normal heart rate  noted  Respiratory: Normal respiratory effort, no distress  Abdomen: Soft, gravid, nontender  Pelvic: Cervical exam deferred         Extremities: Edema: None  Fetal Status: Fetal Heart Rate (bpm): 147 Fundal Height: 23 cm Movement: Present    Chaperone: N/A   No results found for this or any previous visit (from the past 24 hour(s)).  Assessment & Plan:  1) Low-risk pregnancy G1P0000 at [redacted]w[redacted]d with an Estimated Date of Delivery: 01/27/22   2) Occasional swelling in legs, bp good, elevate legs when able   Meds: No orders of the defined types were placed in this encounter.  Labs/procedures today: none  Plan:  Continue routine obstetrical care  Next visit: prefers will be in person for pn2     Reviewed: Preterm labor symptoms and general obstetric precautions including but not limited to vaginal bleeding, contractions, leaking of fluid and fetal movement were reviewed in detail with the patient.  All questions were answered. Does have home bp cuff. Office bp cuff given: not applicable. Check bp weekly, let us know if consistently >140 and/or >90.  Follow-up: Return in about 4 weeks (around 10/31/2021) for LROB, PN2, CNM, in person.  No future appointments.  No orders of the defined types were placed in this encounter.  Cheral Marker CNM, [facility] 10/03/2021 11:58 AM

## 2021-10-28 ENCOUNTER — Encounter: Payer: Self-pay | Admitting: Women's Health

## 2021-11-01 ENCOUNTER — Encounter: Payer: Self-pay | Admitting: Women's Health

## 2021-11-01 ENCOUNTER — Ambulatory Visit (INDEPENDENT_AMBULATORY_CARE_PROVIDER_SITE_OTHER): Payer: 59 | Admitting: Women's Health

## 2021-11-01 ENCOUNTER — Other Ambulatory Visit: Payer: 59

## 2021-11-01 VITALS — BP 124/75 | HR 98 | Wt 157.0 lb

## 2021-11-01 DIAGNOSIS — Z3A27 27 weeks gestation of pregnancy: Secondary | ICD-10-CM

## 2021-11-01 DIAGNOSIS — Z3482 Encounter for supervision of other normal pregnancy, second trimester: Secondary | ICD-10-CM

## 2021-11-01 DIAGNOSIS — Z3402 Encounter for supervision of normal first pregnancy, second trimester: Secondary | ICD-10-CM

## 2021-11-01 DIAGNOSIS — Z131 Encounter for screening for diabetes mellitus: Secondary | ICD-10-CM

## 2021-11-01 NOTE — Progress Notes (Signed)
ROB 27.[redacted] wks GA GTT, CBC, RPR, HIV today TDAP offered and considering Depression and anxiety screen WNL

## 2021-11-01 NOTE — Patient Instructions (Signed)
Nicole Mckinney, thank you for choosing our office today! We appreciate the opportunity to meet your healthcare needs. You may receive a short survey by mail, e-mail, or through MyChart. If you are happy with your care we would appreciate if you could take just a few minutes to complete the survey questions. We read all of your comments and take your feedback very seriously. Thank you again for choosing our office.  [facility] ([address] [place], [place] [address]) Entrance C, located off of [address] Free 24/7 valet parking   CLASSES: Go to [email or web address] to register for classes (childbirth, breastfeeding, waterbirth, infant CPR, daddy bootcamp, etc.)  Call the office ([phone]) or go to [facility] if: You begin to have strong, frequent contractions Your water breaks.  Sometimes it is a big gush of fluid, sometimes it is just a trickle that keeps getting your panties wet or running down your legs You have vaginal bleeding.  It is normal to have a small amount of spotting if your cervix was checked.  You don't feel your baby moving like normal.  If you don't, get you something to eat and drink and lay down and focus on feeling your baby move.   If your baby is still not moving like normal, you should call the office or go to [facility].  Call the office ([phone]) or go to [facility] for these signs of pre-eclampsia: Severe headache that does not go away with Tylenol Visual changes- seeing spots, double, blurred vision Pain under your right breast or upper abdomen that does not go away with Tums or heartburn medicine Nausea and/or vomiting Severe swelling in your hands, feet, and face   Tdap Vaccine It is recommended that you get the Tdap vaccine during the third trimester of EACH pregnancy to help protect your baby from getting pertussis (whooping cough) 27-36 weeks is the BEST time to do  this so that you can pass the protection on to your baby. During pregnancy is better than after pregnancy, but if you are unable to get it during pregnancy it will be offered at the hospital.  You can get this vaccine with us, at the health department, your family doctor, or some local pharmacies Everyone who will be around your baby should also be up-to-date on their vaccines before the baby comes. Adults (who are not pregnant) only need 1 dose of Tdap during adulthood.   [place] Pediatric[facility]/Family Doctors [facility] ([facility]): [address], [phone]           [facility]: [address], [phone]                [facility] ([facility]): [address], [phone] (call to ask if accepting patients) [facility]: [address], [place], [phone]    [place] Pediatric[facility]/Family Doctors [facility] ([facility]): [address], [phone] [facility]: [address], [phone] [facility]: [address], [phone]  [place] [facility]  [facility] ([facility]): [phone] [facility]: [address], [phone]   [place] [facility]: [address], [phone]  [place] [facility]  [facility]: [address], [phone]  Home Blood Pressure Monitoring for Patients   Your provider has recommended that you check your   blood pressure (BP) at least once a week at home. If you do not have a blood pressure cuff at home, one will be provided for you. Contact your provider if you have not received your monitor within 1 week.   Helpful Tips for Accurate Home Blood Pressure Checks  Don't smoke, exercise, or drink caffeine 30 minutes before checking your BP Use the restroom before checking your BP (a full bladder can raise your  pressure) Relax in a comfortable upright chair Feet on the ground Left arm resting comfortably on a flat surface at the level of your heart Legs uncrossed Back supported Sit quietly and don't talk Place the cuff on your bare arm Adjust snuggly, so that only two fingertips can fit between your skin and the top of the cuff Check 2 readings separated by at least one minute Keep a log of your BP readings For a visual, please reference this diagram: http://ccnc.care/bpdiagram  Provider Name: [facility]     Phone: [phone]  Zone 1: ALL CLEAR  Continue to monitor your symptoms:  BP reading is less than 140 (top number) or less than 90 (bottom number)  No right upper stomach pain No headaches or seeing spots No feeling nauseated or throwing up No swelling in face and hands  Zone 2: CAUTION Call your doctor's office for any of the following:  BP reading is greater than 140 (top number) or greater than 90 (bottom number)  Stomach pain under your ribs in the middle or right side Headaches or seeing spots Feeling nauseated or throwing up Swelling in face and hands  Zone 3: EMERGENCY  Seek immediate medical care if you have any of the following:  BP reading is greater than160 (top number) or greater than 110 (bottom number) Severe headaches not improving with Tylenol Serious difficulty catching your breath Any worsening symptoms from Zone 2   Third Trimester of Pregnancy The third trimester is from week 29 through week 42, months 7 through 9. The third trimester is a time when the fetus is growing rapidly. At the end of the ninth month, the fetus is about 20 inches in length and weighs 6-10 pounds.  BODY CHANGES Your body goes through many changes during pregnancy. The changes vary from woman to woman.  Your weight will continue to increase. You can expect to gain 25-35 pounds (11-16 kg) by the end of the pregnancy. You may begin to get stretch marks on your hips, abdomen,  and breasts. You may urinate more often because the fetus is moving lower into your pelvis and pressing on your bladder. You may develop or continue to have heartburn as a result of your pregnancy. You may develop constipation because certain hormones are causing the muscles that push waste through your intestines to slow down. You may develop hemorrhoids or swollen, bulging veins (varicose veins). You may have pelvic pain because of the weight gain and pregnancy hormones relaxing your joints between the bones in your pelvis. Backaches may result from overexertion of the muscles supporting your posture. You may have changes in your hair. These can include thickening of your hair, rapid growth, and changes in texture. Some women also have hair loss during or after pregnancy, or hair that feels dry or thin. Your hair will most likely return to normal after your baby is born. Your breasts will continue to grow and be tender. A yellow discharge may leak from your breasts called colostrum. Your belly button may stick out. You may   feel short of breath because of your expanding uterus. You may notice the fetus "dropping," or moving lower in your abdomen. You may have a bloody mucus discharge. This usually occurs a few days to a week before labor begins. Your cervix becomes thin and soft (effaced) near your due date. WHAT TO EXPECT AT YOUR PRENATAL EXAMS  You will have prenatal exams every 2 weeks until week 36. Then, you will have weekly prenatal exams. During a routine prenatal visit: You will be weighed to make sure you and the fetus are growing normally. Your blood pressure is taken. Your abdomen will be measured to track your baby's growth. The fetal heartbeat will be listened to. Any test results from the previous visit will be discussed. You may have a cervical check near your due date to see if you have effaced. At around 36 weeks, your caregiver will check your cervix. At the same time, your  caregiver will also perform a test on the secretions of the vaginal tissue. This test is to determine if a type of bacteria, Group B streptococcus, is present. Your caregiver will explain this further. Your caregiver may ask you: What your birth plan is. How you are feeling. If you are feeling the baby move. If you have had any abnormal symptoms, such as leaking fluid, bleeding, severe headaches, or abdominal cramping. If you have any questions. Other tests or screenings that may be performed during your third trimester include: Blood tests that check for low iron levels (anemia). Fetal testing to check the health, activity level, and growth of the fetus. Testing is done if you have certain medical conditions or if there are problems during the pregnancy. FALSE LABOR You may feel small, irregular contractions that eventually go away. These are called Braxton Hicks contractions, or false labor. Contractions may last for hours, days, or even weeks before true labor sets in. If contractions come at regular intervals, intensify, or become painful, it is best to be seen by your caregiver.  SIGNS OF LABOR  Menstrual-like cramps. Contractions that are 5 minutes apart or less. Contractions that start on the top of the uterus and spread down to the lower abdomen and back. A sense of increased pelvic pressure or back pain. A watery or bloody mucus discharge that comes from the vagina. If you have any of these signs before the 37th week of pregnancy, call your caregiver right away. You need to go to the hospital to get checked immediately. HOME CARE INSTRUCTIONS  Avoid all smoking, herbs, alcohol, and unprescribed drugs. These chemicals affect the formation and growth of the baby. Follow your caregiver's instructions regarding medicine use. There are medicines that are either safe or unsafe to take during pregnancy. Exercise only as directed by your caregiver. Experiencing uterine cramps is a good sign to  stop exercising. Continue to eat regular, healthy meals. Wear a good support bra for breast tenderness. Do not use hot tubs, steam rooms, or saunas. Wear your seat belt at all times when driving. Avoid raw meat, uncooked cheese, cat litter boxes, and soil used by cats. These carry germs that can cause birth defects in the baby. Take your prenatal vitamins. Try taking a stool softener (if your caregiver approves) if you develop constipation. Eat more high-fiber foods, such as fresh vegetables or fruit and whole grains. Drink plenty of fluids to keep your urine clear or pale yellow. Take warm sitz baths to soothe any pain or discomfort caused by hemorrhoids. Use hemorrhoid cream if   your caregiver approves. If you develop varicose veins, wear support hose. Elevate your feet for 15 minutes, 3-4 times a day. Limit salt in your diet. Avoid heavy lifting, wear low heal shoes, and practice good posture. Rest a lot with your legs elevated if you have leg cramps or low back pain. Visit your dentist if you have not gone during your pregnancy. Use a soft toothbrush to brush your teeth and be gentle when you floss. A sexual relationship may be continued unless your caregiver directs you otherwise. Do not travel far distances unless it is absolutely necessary and only with the approval of your caregiver. Take prenatal classes to understand, practice, and ask questions about the labor and delivery. Make a trial run to the hospital. Pack your hospital bag. Prepare the baby's nursery. Continue to go to all your prenatal visits as directed by your caregiver. SEEK MEDICAL CARE IF: You are unsure if you are in labor or if your water has broken. You have dizziness. You have mild pelvic cramps, pelvic pressure, or nagging pain in your abdominal area. You have persistent nausea, vomiting, or diarrhea. You have a bad smelling vaginal discharge. You have pain with urination. SEEK IMMEDIATE MEDICAL CARE IF:  You  have a fever. You are leaking fluid from your vagina. You have spotting or bleeding from your vagina. You have severe abdominal cramping or pain. You have rapid weight loss or gain. You have shortness of breath with chest pain. You notice sudden or extreme swelling of your face, hands, ankles, feet, or legs. You have not felt your baby move in over an hour. You have severe headaches that do not go away with medicine. You have vision changes. Document Released: 03/14/2001 Document Revised: 03/25/2013 Document Reviewed: 05/21/2012 ExitCare Patient Information 2015 [employer]. This information is not intended to replace advice given to you by your health care provider. Make sure you discuss any questions you have with your health care provider.       

## 2021-11-01 NOTE — Progress Notes (Signed)
LOW-RISK PREGNANCY VISIT Patient name: Nicole Mckinney MRN [id number]  Date of birth: 08/30/2000 Chief Complaint:   Routine Prenatal Visit  History of Present Illness:   Nicole Mckinney is a 21 y.o. G67P0000 female at [redacted]w[redacted]d with an Estimated Date of Delivery: 01/27/22 being seen today for ongoing management of a low-risk pregnancy.   Today she reports no complaints. Contractions: Not present. Vag. Bleeding: None.  Movement: Present. denies leaking of fluid.     11/01/2021    8:58 AM 07/18/2021    2:01 PM 08/01/2017    8:46 AM  Depression screen PHQ 2/9  Decreased Interest 0 0 0  Down, Depressed, Hopeless 0 0 0  PHQ - 2 Score 0 0 0  Altered sleeping 0 0   Tired, decreased energy 1 1   Change in appetite 0 0   Feeling bad or failure about yourself  0 0   Trouble concentrating 0 0   Moving slowly or fidgety/restless 0 0   Suicidal thoughts 0 0   PHQ-9 Score 1 1         11/01/2021    8:59 AM 07/18/2021    2:01 PM  GAD 7 : Generalized Anxiety Score  Nervous, Anxious, on Edge 1 1  Control/stop worrying 0 1  Worry too much - different things 1 1  Trouble relaxing 0 0  Restless 1 0  Easily annoyed or irritable 1 0  Afraid - awful might happen 0 0  Total GAD 7 Score 4 3      Review of Systems:   Pertinent items are noted in HPI Denies abnormal vaginal discharge w/ itching/odor/irritation, headaches, visual changes, shortness of breath, chest pain, abdominal pain, severe nausea/vomiting, or problems with urination or bowel movements unless otherwise stated above. Pertinent History Reviewed:  Reviewed past medical,surgical, social, obstetrical and family history.  Reviewed problem list, medications and allergies. Physical Assessment:   Vitals:   11/01/21 0851  BP: 124/75  Pulse: 98  Weight: 157 lb (71.2 kg)  Body mass index is 26.13 kg/m.        Physical Examination:   General appearance: Well appearing, and in no distress  Mental status: Alert, oriented to person,  place, and time  Skin: Warm & dry  Cardiovascular: Normal heart rate noted  Respiratory: Normal respiratory effort, no distress  Abdomen: Soft, gravid, nontender  Pelvic: Cervical exam deferred         Extremities: Edema: Trace  Fetal Status: Fetal Heart Rate (bpm): 158 Fundal Height: 26 cm Movement: Present    Chaperone: N/A   No results found for this or any previous visit (from the past 24 hour(s)).  Assessment & Plan:  1) Low-risk pregnancy G1P0000 at [redacted]w[redacted]d with an Estimated Date of Delivery: 01/27/22    Meds: No orders of the defined types were placed in this encounter.  Labs/procedures today: PN2 and declines tdap today, maybe next visit  Plan:  Continue routine obstetrical care  Next visit: prefers in person    Reviewed: Preterm labor symptoms and general obstetric precautions including but not limited to vaginal bleeding, contractions, leaking of fluid and fetal movement were reviewed in detail with the patient.  All questions were answered. Does have home bp cuff. Office bp cuff given: not applicable. Check bp weekly, let us know if consistently >140 and/or >90.  Follow-up: Return in about 3 weeks (around 11/22/2021) for LROB, CNM, in person.  No future appointments.  No orders of the defined types were  placed in this encounter.  Cheral Marker CNM, [facility] 11/01/2021 9:11 AM

## 2021-11-02 LAB — CBC
Hematocrit: 26.5 % — ABNORMAL LOW (ref 34.0–46.6)
Hemoglobin: 8.4 g/dL — ABNORMAL LOW (ref 11.1–15.9)
MCH: 26.8 pg (ref 26.6–33.0)
MCHC: 31.7 g/dL (ref 31.5–35.7)
MCV: 84 fL (ref 79–97)
Platelets: 278 10*3/uL (ref 150–450)
RBC: 3.14 x10E6/uL — ABNORMAL LOW (ref 3.77–5.28)
RDW: 13.4 % (ref 11.7–15.4)
WBC: 7.8 10*3/uL (ref 3.4–10.8)

## 2021-11-02 LAB — GLUCOSE TOLERANCE, 2 HOURS W/ 1HR
Glucose, 1 hour: 118 mg/dL (ref 70–179)
Glucose, 2 hour: 113 mg/dL (ref 70–152)
Glucose, Fasting: 78 mg/dL (ref 70–91)

## 2021-11-02 LAB — RPR: RPR Ser Ql: NONREACTIVE

## 2021-11-02 LAB — HIV ANTIBODY (ROUTINE TESTING W REFLEX): HIV Screen 4th Generation wRfx: NONREACTIVE

## 2021-11-02 LAB — ANTIBODY SCREEN: Antibody Screen: NEGATIVE

## 2021-11-02 MED ORDER — FERROUS SULFATE 325 (65 FE) MG PO TABS: 325.0000 mg | ORAL_TABLET | ORAL | 2 refills | Status: DC

## 2021-11-02 NOTE — Addendum Note (Signed)
Addended by: Cheral Marker on: 11/02/2021 08:05 AM   Modules accepted: Orders

## 2021-11-14 ENCOUNTER — Encounter: Payer: Self-pay | Admitting: Women's Health

## 2021-11-22 ENCOUNTER — Ambulatory Visit (INDEPENDENT_AMBULATORY_CARE_PROVIDER_SITE_OTHER): Payer: 59 | Admitting: Women's Health

## 2021-11-22 ENCOUNTER — Encounter: Payer: Self-pay | Admitting: Women's Health

## 2021-11-22 VITALS — BP 121/77 | HR 109 | Wt 161.0 lb

## 2021-11-22 DIAGNOSIS — O99013 Anemia complicating pregnancy, third trimester: Secondary | ICD-10-CM

## 2021-11-22 DIAGNOSIS — Z3403 Encounter for supervision of normal first pregnancy, third trimester: Secondary | ICD-10-CM

## 2021-11-22 NOTE — Progress Notes (Signed)
LOW-RISK PREGNANCY VISIT Patient name: Nicole Mckinney MRN [id number]  Date of birth: 2000-06-11 Chief Complaint:   Routine Prenatal Visit  History of Present Illness:   Nicole Mckinney is a 21 y.o. G80P0000 female at [redacted]w[redacted]d with an Estimated Date  of Delivery: 01/27/22 being seen today for ongoing management of a low-risk pregnancy.   Today she reports no complaints. Contractions: Not present. Vag. Bleeding: None.  Movement: Present. denies leaking of fluid.     11/01/2021    8:58 AM 07/18/2021    2:01 PM 08/01/2017    8:46 AM  Depression screen PHQ 2/9  Decreased Interest 0 0 0  Down, Depressed, Hopeless 0 0 0  PHQ - 2 Score 0 0 0  Altered sleeping 0 0   Tired, decreased energy 1 1   Change in appetite 0 0   Feeling bad or failure about yourself  0 0   Trouble concentrating 0 0   Moving slowly or fidgety/restless 0 0   Suicidal thoughts 0 0   PHQ-9 Score 1 1         11/01/2021    8:59 AM 07/18/2021    2:01 PM  GAD 7 : Generalized Anxiety Score  Nervous, Anxious, on Edge 1 1  Control/stop worrying 0 1  Worry too much - different things 1 1  Trouble relaxing 0 0  Restless 1 0  Easily annoyed or irritable 1 0  Afraid - awful might happen 0 0  Total GAD 7 Score 4 3      Review of Systems:   Pertinent items are noted in HPI Denies abnormal vaginal discharge w/ itching/odor/irritation, headaches, visual changes, shortness of breath, chest pain, abdominal pain, severe nausea/vomiting, or problems with urination or bowel movements unless otherwise stated above. Pertinent History Reviewed:  Reviewed past medical,surgical, social, obstetrical and family history.  Reviewed problem list, medications and allergies. Physical Assessment:   Vitals:   11/22/21 0856  BP: 121/77  Pulse: (!) 109  Weight: 161 lb (73 kg)  Body mass index is 26.79 kg/m.        Physical Examination:   General appearance: Well appearing, and in no distress  Mental status: Alert, oriented to  person, place, and time  Skin: Warm & dry  Cardiovascular: Normal heart rate noted  Respiratory: Normal respiratory effort, no distress  Abdomen: Soft, gravid, nontender  Pelvic: Cervical exam deferred         Extremities: Edema: None  Fetal Status: Fetal Heart Rate (bpm): 149 Fundal Height: 29 cm Movement: Present    Chaperone: N/A   No results found for this or any previous visit (from the past 24 hour(s)).  Assessment & Plan:  1) Low-risk pregnancy G1P0000 at [redacted]w[redacted]d with an Estimated Date of Delivery: 01/27/22   2) Anemia, last hgb 8.4, taking po fe qod, check cbc today, if not improved will order iv venofer   Meds: No orders of the defined types were placed in this encounter.  Labs/procedures today:  cbc  Plan:  Continue routine obstetrical care  Next visit: prefers in person    Reviewed: Preterm labor symptoms and general obstetric precautions including but not limited to vaginal bleeding, contractions, leaking of fluid and fetal movement were reviewed in detail with the patient.  All questions were answered. Does have home bp cuff. Office bp cuff given: not applicable. Check bp weekly, let us know if consistently >140 and/or >90.  Follow-up: Return in about 2 weeks (around 12/06/2021) for  LROB, CNM, in person.  Future Appointments  Date Time Provider Department Center  12/08/2021 11:50 AM Myna Hidalgo, DO [facility]    Orders Placed This Encounter  Procedures   CBC   Cheral Marker CNM, [facility] 11/22/2021 9:33 AM

## 2021-11-22 NOTE — Patient Instructions (Signed)
Kristelle, thank you for choosing our office today! We appreciate the opportunity to meet your healthcare needs. You may receive a short survey by mail, e-mail, or through MyChart. If you are happy with your care we would appreciate if you could take just a few minutes to complete the survey questions. We read all of your comments and take your feedback very seriously. Thank you again for choosing our office.  [facility]  [facility] ([address] [place], [place] [address]) Entrance C, located off of [address] Free 24/7 valet parking   CLASSES: Go to [email or web address] to register for classes (childbirth, breastfeeding, waterbirth, infant CPR, daddy bootcamp, etc.)  Call the office ([phone]) or go to [facility] if: You begin to have strong, frequent contractions Your water breaks.  Sometimes it is a big gush of fluid, sometimes it is just a trickle that keeps getting your panties wet or running down your legs You have vaginal bleeding.  It is normal to have a small amount of spotting if your cervix was checked.  You don't feel your baby moving like normal.  If you don't, get you something to eat and drink and lay down and focus on feeling your baby move.   If your baby is still not moving like normal, you should call the office or go to [facility].  Call the office ([phone]) or [facility] for these signs of pre-eclampsia: Severe headache that does not go away with Tylenol Visual changes- seeing spots, double, blurred vision Pain under your right breast or upper abdomen that does not go away with Tums or heartburn medicine Nausea and/or vomiting Severe swelling in your hands, feet, and face   Tdap Vaccine It is recommended that you get the Tdap vaccine during the third trimester of EACH pregnancy to help protect your baby from getting pertussis (whooping cough) 27-36 weeks is the BEST time to do  this so that you can pass the protection on to your baby. During pregnancy is better than after pregnancy, but if you are unable to get it during pregnancy it will be offered at the hospital.  You can get this vaccine with us, at the health department, your family doctor, or some local pharmacies Everyone who will be around your baby should also be up-to-date on their vaccines before the baby comes. Adults (who are not pregnant) only need 1 dose of Tdap during adulthood.   [place] Pediatricians/Family Doctors [facility] ([facility]): [address], [phone]           [facility]: [address], [phone]                [facility] ([facility]): [address], [phone] (call to ask if accepting patients) [facility]: [address], [place], [phone]    [place] [facility]/Family Doctors [facility] ([facility]): [address], [phone] [facility]: [address], [phone] [facility]: [address], [phone]  [place] [facility]  [facility] ([facility]): [phone] [facility]: [address], [phone]   [place] [facility]: [address], [phone]  [place] [facility]  [facility]: [address], [phone]  Home Blood Pressure Monitoring for Patients   Your provider has recommended that you check your   blood pressure (BP) at least once a week at home. If you do not have a blood pressure cuff at home, one will be provided for you. Contact your provider if you have not received your monitor within 1 week.   Helpful Tips for Accurate Home Blood Pressure Checks  Don't smoke, exercise, or drink caffeine 30 minutes before checking your BP Use the restroom before checking your BP (a full bladder can raise your  pressure) Relax in a comfortable upright chair Feet on the ground Left arm resting comfortably on a flat surface at the level of your heart Legs uncrossed Back supported Sit quietly and don't talk Place the cuff on your bare arm Adjust snuggly, so that only two fingertips can fit between your skin and the top of the cuff Check 2 readings separated by at least one minute Keep a log of your BP readings For a visual, please reference this diagram: http://ccnc.care/bpdiagram  Provider Name: [facility]     Phone: [phone]  Zone 1: ALL CLEAR  Continue to monitor your symptoms:  BP reading is less than 140 (top number) or less than 90 (bottom number)  No right upper stomach pain No headaches or seeing spots No feeling nauseated or throwing up No swelling in face and hands  Zone 2: CAUTION Call your doctor's office for any of the following:  BP reading is greater than 140 (top number) or greater than 90 (bottom number)  Stomach pain under your ribs in the middle or right side Headaches or seeing spots Feeling nauseated or throwing up Swelling in face and hands  Zone 3: EMERGENCY  Seek immediate medical care if you have any of the following:  BP reading is greater than160 (top number) or greater than 110 (bottom number) Severe headaches not improving with Tylenol Serious difficulty catching your breath Any worsening symptoms from Zone 2  Preterm Labor and Birth Information  The normal length of a pregnancy is 39-41 weeks. Preterm labor is when labor starts before 37 completed weeks of pregnancy. What are the risk factors for preterm labor? Preterm labor is more likely to occur in women who: Have certain infections during pregnancy such as a bladder infection, sexually transmitted infection, or infection inside the uterus (chorioamnionitis). Have a shorter-than-normal cervix. Have gone into preterm labor before. Have had surgery on their cervix. Are younger than age 17  or older than age 35. Are African American. Are pregnant with twins or multiple babies (multiple gestation). Take street drugs or smoke while pregnant. Do not gain enough weight while pregnant. Became pregnant shortly after having been pregnant. What are the symptoms of preterm labor? Symptoms of preterm labor include: Cramps similar to those that can happen during a menstrual period. The cramps may happen with diarrhea. Pain in the abdomen or lower back. Regular uterine contractions that may feel like tightening of the abdomen. A feeling of increased pressure in the pelvis. Increased watery or bloody mucus discharge from the vagina. Water breaking (ruptured amniotic sac). Why is it important to recognize signs of preterm labor? It is important to recognize signs of preterm labor because babies who are born prematurely may not be fully developed. This can put them at an increased risk for: Long-term (chronic) heart and lung problems. Difficulty immediately after birth with regulating body systems, including blood sugar, body temperature, heart rate, and breathing rate. Bleeding in the brain. Cerebral palsy. Learning difficulties. Death. These risks are highest for babies who are born before 34 weeks   of pregnancy. How is preterm labor treated? Treatment depends on the length of your pregnancy, your condition, and the health of your baby. It may involve: Having a stitch (suture) placed in your cervix to prevent your cervix from opening too early (cerclage). Taking or being given medicines, such as: Hormone medicines. These may be given early in pregnancy to help support the pregnancy. Medicine to stop contractions. Medicines to help mature the baby's lungs. These may be prescribed if the risk of delivery is high. Medicines to prevent your baby from developing cerebral palsy. If the labor happens before 34 weeks of pregnancy, you may need to stay in the hospital. What should I do if I  think I am in preterm labor? If you think that you are going into preterm labor, call your health care provider right away. How can I prevent preterm labor in future pregnancies? To increase your chance of having a full-term pregnancy: Do not use any tobacco products, such as cigarettes, chewing tobacco, and e-cigarettes. If you need help quitting, ask your health care provider. Do not use street drugs or medicines that have not been prescribed to you during your pregnancy. Talk with your health care provider before taking any herbal supplements, even if you have been taking them regularly. Make sure you gain a healthy amount of weight during your pregnancy. Watch for infection. If you think that you might have an infection, get it checked right away. Make sure to tell your health care provider if you have gone into preterm labor before. This information is not intended to replace advice given to you by your health care provider. Make sure you discuss any questions you have with your health care provider. Document Revised: 07/12/2018 Document Reviewed: 08/11/2015 Elsevier Patient Education  2020 Elsevier Inc.   

## 2021-11-23 ENCOUNTER — Other Ambulatory Visit: Payer: Self-pay | Admitting: Women's Health

## 2021-11-23 DIAGNOSIS — O99019 Anemia complicating pregnancy, unspecified trimester: Secondary | ICD-10-CM | POA: Insufficient documentation

## 2021-11-23 DIAGNOSIS — O99013 Anemia complicating pregnancy, third trimester: Secondary | ICD-10-CM

## 2021-11-23 LAB — CBC
Hematocrit: 26 % — ABNORMAL LOW (ref 34.0–46.6)
Hemoglobin: 8.1 g/dL — ABNORMAL LOW (ref 11.1–15.9)
MCH: 25.7 pg — ABNORMAL LOW (ref 26.6–33.0)
MCHC: 31.2 g/dL — ABNORMAL LOW (ref 31.5–35.7)
MCV: 83 fL (ref 79–97)
Platelets: 271 10*3/uL (ref 150–450)
RBC: 3.15 x10E6/uL — ABNORMAL LOW (ref 3.77–5.28)
RDW: 14.1 % (ref 11.7–15.4)
WBC: 7.5 10*3/uL (ref 3.4–10.8)

## 2021-11-28 ENCOUNTER — Telehealth: Payer: Self-pay | Admitting: Pharmacy Technician

## 2021-11-28 ENCOUNTER — Other Ambulatory Visit: Payer: Self-pay | Admitting: Women's Health

## 2021-11-28 NOTE — Telephone Encounter (Signed)
Patient has new insurance that will begin 12/02/21. Patient will be scheduled after 12/02/21.  Current insurance: [employer] plan which will term 12/01/21.  Spoke with patient and she is aware to bring new insurance card at appt. [employer] [phone] ID: [id number]

## 2021-12-07 ENCOUNTER — Encounter: Payer: Self-pay | Admitting: Women's Health

## 2021-12-08 ENCOUNTER — Ambulatory Visit (INDEPENDENT_AMBULATORY_CARE_PROVIDER_SITE_OTHER): Payer: BC Managed Care – PPO | Admitting: Obstetrics & Gynecology

## 2021-12-08 ENCOUNTER — Encounter: Payer: Self-pay | Admitting: Obstetrics & Gynecology

## 2021-12-08 VITALS — BP 131/78 | HR 101 | Wt 164.2 lb

## 2021-12-08 DIAGNOSIS — O99013 Anemia complicating pregnancy, third trimester: Secondary | ICD-10-CM

## 2021-12-08 DIAGNOSIS — Z3403 Encounter for supervision of normal first pregnancy, third trimester: Secondary | ICD-10-CM

## 2021-12-08 NOTE — Progress Notes (Signed)
   LOW-RISK PREGNANCY VISIT Patient name: Nicole Mckinney MRN [id number]  Date of birth: 11-29-2000 Chief Complaint:   Routine Prenatal Visit  History of Present Illness:   Nicole Mckinney is a 21 y.o. G28P0000 female at [redacted]w[redacted]d with an Estimated Date of Delivery: 01/27/22 being seen today for ongoing management of a low-risk pregnancy.   -Anemia- scheduled for infusions next week     11/01/2021    8:58 AM 07/18/2021    2:01 PM 08/01/2017    8:46 AM  Depression screen PHQ 2/9  Decreased Interest 0 0 0  Down, Depressed, Hopeless 0 0 0  PHQ - 2 Score 0 0 0  Altered sleeping 0 0   Tired, decreased energy 1 1   Change in appetite 0 0   Feeling bad or failure about yourself  0 0   Trouble concentrating 0 0   Moving slowly or fidgety/restless 0 0   Suicidal thoughts 0 0   PHQ-9 Score 1 1     Today she reports no complaints. Contractions: Not present. Vag. Bleeding: None.  Movement: Present. denies leaking of fluid. Review of Systems:   Pertinent items are noted in HPI Denies abnormal vaginal discharge w/ itching/odor/irritation, headaches, visual changes, shortness of breath, chest pain, abdominal pain, severe nausea/vomiting, or problems with urination or bowel movements unless otherwise stated above. Pertinent History Reviewed:  Reviewed past medical,surgical, social, obstetrical and family history.  Reviewed problem list, medications and allergies.  Physical Assessment:   Vitals:   12/08/21 1151  BP: 131/78  Pulse: (!) 101  Weight: 164 lb 3.2 oz (74.5 kg)  Body mass index is 27.32 kg/m.        Physical Examination:   General appearance: Well appearing, and in no distress  Mental status: Alert, oriented to person, place, and time  Skin: Warm & dry  Respiratory: Normal respiratory effort, no distress  Abdomen: Soft, gravid, nontender  Pelvic: Cervical exam deferred         Extremities: Edema: None  Psych:  mood and affect appropriate  Fetal Status: Fetal Heart Rate  (bpm): 145 Fundal Height: 31 cm Movement: Present    Chaperone: n/a    No results found for this or any previous visit (from the past 24 hour(s)).   Assessment & Plan:  1) Low-risk pregnancy G1P0000 at [redacted]w[redacted]d with an Estimated Date of Delivery: 01/27/22   2) Anemia- schedule for iron transfusion []  plan to repeat ~ 36wk   Meds: No orders of the defined types were placed in this encounter.  Labs/procedures today: none  Plan:  Continue routine obstetrical care  Next visit: prefers in person    Reviewed: Preterm labor symptoms and general obstetric precautions including but not limited to vaginal bleeding, contractions, leaking of fluid and fetal movement were reviewed in detail with the patient.  All questions were answered. Pt has home bp cuff. Check bp weekly, let know if >140/90.   Follow-up: Return in about 2 weeks (around 12/22/2021) for LROB visit.  No orders of the defined types were placed in this encounter.   12/24/2021, DO Attending Obstetrician & Gynecologist, [facility] for [facility]

## 2021-12-09 ENCOUNTER — Ambulatory Visit (INDEPENDENT_AMBULATORY_CARE_PROVIDER_SITE_OTHER): Payer: BC Managed Care – PPO

## 2021-12-09 VITALS — BP 123/77 | HR 88 | Temp 97.9°F | Resp 12 | Ht 65.0 in | Wt 164.8 lb

## 2021-12-09 DIAGNOSIS — O99013 Anemia complicating pregnancy, third trimester: Secondary | ICD-10-CM

## 2021-12-09 DIAGNOSIS — D508 Other iron deficiency anemias: Secondary | ICD-10-CM

## 2021-12-09 DIAGNOSIS — Z3403 Encounter for supervision of normal first pregnancy, third trimester: Secondary | ICD-10-CM

## 2021-12-09 MED ORDER — SODIUM CHLORIDE 0.9 % IV SOLN
200.0000 mg | Freq: Once | INTRAVENOUS | Status: AC
  Administered 2021-12-09: 200 mg via INTRAVENOUS
  Filled 2021-12-09: qty 10

## 2021-12-09 NOTE — Progress Notes (Signed)
Diagnosis: Iron Deficiency Anemia  Provider:  Chilton Greathouse MD  Procedure: Infusion  IV Type: Peripheral, IV Location: R Antecubital  Venofer (Iron Sucrose), Dose: 200 mg  Infusion Start Time: 1322  Infusion Stop Time: 1415  Post Infusion IV Care: Observation period completed and Peripheral IV Discontinued  Discharge: Condition: Good, Destination: Home . AVS provided to patient.   Performed by:  Garnette Czech, RN

## 2021-12-12 ENCOUNTER — Ambulatory Visit (INDEPENDENT_AMBULATORY_CARE_PROVIDER_SITE_OTHER): Payer: BC Managed Care – PPO

## 2021-12-12 VITALS — BP 107/71 | HR 76 | Temp 98.7°F | Resp 16 | Ht 65.0 in | Wt 167.0 lb

## 2021-12-12 DIAGNOSIS — D508 Other iron deficiency anemias: Secondary | ICD-10-CM

## 2021-12-12 DIAGNOSIS — Z3403 Encounter for supervision of normal first pregnancy, third trimester: Secondary | ICD-10-CM

## 2021-12-12 DIAGNOSIS — O99013 Anemia complicating pregnancy, third trimester: Secondary | ICD-10-CM | POA: Diagnosis not present

## 2021-12-12 MED ORDER — SODIUM CHLORIDE 0.9 % IV SOLN
200.0000 mg | Freq: Once | INTRAVENOUS | Status: AC
  Administered 2021-12-12: 200 mg via INTRAVENOUS
  Filled 2021-12-12: qty 10

## 2021-12-12 NOTE — Progress Notes (Signed)
Diagnosis: Iron Deficiency Anemia  Provider:  Chilton Greathouse MD  Procedure: Infusion  IV Type: Peripheral, IV Location: R Antecubital  Venofer (Iron Sucrose), Dose: 200 mg  Infusion Start Time: 1132  Infusion Stop Time: 1148   Post Infusion IV Care: Peripheral IV Discontinued  Discharge: Condition: Good, Destination: Home . AVS provided to patient.   Performed by:  Loney Hering, LPN

## 2021-12-14 ENCOUNTER — Ambulatory Visit (INDEPENDENT_AMBULATORY_CARE_PROVIDER_SITE_OTHER): Payer: BC Managed Care – PPO

## 2021-12-14 VITALS — BP 101/65 | HR 78 | Temp 98.6°F | Resp 18 | Ht 65.0 in | Wt 167.0 lb

## 2021-12-14 DIAGNOSIS — O99013 Anemia complicating pregnancy, third trimester: Secondary | ICD-10-CM | POA: Diagnosis not present

## 2021-12-14 DIAGNOSIS — D508 Other iron deficiency anemias: Secondary | ICD-10-CM | POA: Diagnosis not present

## 2021-12-14 DIAGNOSIS — Z3403 Encounter for supervision of normal first pregnancy, third trimester: Secondary | ICD-10-CM

## 2021-12-14 MED ORDER — SODIUM CHLORIDE 0.9 % IV SOLN
200.0000 mg | Freq: Once | INTRAVENOUS | Status: AC
  Administered 2021-12-14: 200 mg via INTRAVENOUS
  Filled 2021-12-14: qty 10

## 2021-12-14 NOTE — Progress Notes (Signed)
Diagnosis: Iron Deficiency Anemia  Provider:  Chilton Greathouse MD  Procedure: Infusion  IV Type: Peripheral, IV Location: L Hand  Venofer (Iron Sucrose), Dose: 200 mg  Infusion Start Time: 1132  Infusion Stop Time: 1150  Post Infusion IV Care: Peripheral IV Discontinued  Discharge: Condition: Good, Destination: Home . AVS provided to patient.   Performed by:  Nat Math, RN

## 2021-12-15 ENCOUNTER — Encounter: Payer: Self-pay | Admitting: Women's Health

## 2021-12-16 ENCOUNTER — Ambulatory Visit (INDEPENDENT_AMBULATORY_CARE_PROVIDER_SITE_OTHER): Payer: BC Managed Care – PPO

## 2021-12-16 VITALS — BP 105/69 | HR 84 | Temp 99.0°F | Resp 18 | Ht 65.0 in | Wt 166.2 lb

## 2021-12-16 DIAGNOSIS — D508 Other iron deficiency anemias: Secondary | ICD-10-CM

## 2021-12-16 DIAGNOSIS — Z3403 Encounter for supervision of normal first pregnancy, third trimester: Secondary | ICD-10-CM

## 2021-12-16 DIAGNOSIS — O99013 Anemia complicating pregnancy, third trimester: Secondary | ICD-10-CM | POA: Diagnosis not present

## 2021-12-16 MED ORDER — SODIUM CHLORIDE 0.9 % IV SOLN
200.0000 mg | Freq: Once | INTRAVENOUS | Status: AC
  Administered 2021-12-16: 200 mg via INTRAVENOUS
  Filled 2021-12-16: qty 10

## 2021-12-16 NOTE — Progress Notes (Signed)
Diagnosis: Iron Deficiency Anemia  Provider:  Chilton Greathouse MD  Procedure: Infusion  IV Type: Peripheral, IV Location: R Hand  Venofer (Iron Sucrose), Dose: 200 mg  Infusion Start Time: 1131  Infusion Stop Time: 1148 am  Post Infusion IV Care: Peripheral IV Discontinued  Discharge: Condition: Good, Destination: Home . AVS provided to patient.   Performed by:  Nat Math, RN

## 2021-12-19 ENCOUNTER — Ambulatory Visit (INDEPENDENT_AMBULATORY_CARE_PROVIDER_SITE_OTHER): Payer: BC Managed Care – PPO

## 2021-12-19 VITALS — BP 98/61 | HR 95 | Temp 98.4°F | Resp 18 | Ht 65.0 in | Wt 165.4 lb

## 2021-12-19 DIAGNOSIS — O99013 Anemia complicating pregnancy, third trimester: Secondary | ICD-10-CM

## 2021-12-19 DIAGNOSIS — D508 Other iron deficiency anemias: Secondary | ICD-10-CM

## 2021-12-19 DIAGNOSIS — Z3403 Encounter for supervision of normal first pregnancy, third trimester: Secondary | ICD-10-CM

## 2021-12-19 MED ORDER — SODIUM CHLORIDE 0.9 % IV SOLN
200.0000 mg | Freq: Once | INTRAVENOUS | Status: AC
  Administered 2021-12-19: 200 mg via INTRAVENOUS
  Filled 2021-12-19: qty 10

## 2021-12-19 NOTE — Progress Notes (Signed)
Diagnosis: Iron Deficiency Anemia  Provider:  Marshell Garfinkel MD  Procedure: Infusion  IV Type: Peripheral, IV Location: L Hand  Venofer (Iron Sucrose), Dose: 200 mg  Infusion Start Time: 9794  Infusion Stop Time: 8016  Post Infusion IV Care: Peripheral IV Discontinued  Discharge: Condition: Good, Destination: Home . AVS provided to patient.   Performed by:  Arnoldo Morale, RN

## 2021-12-20 ENCOUNTER — Ambulatory Visit (INDEPENDENT_AMBULATORY_CARE_PROVIDER_SITE_OTHER): Payer: BC Managed Care – PPO | Admitting: Women's Health

## 2021-12-20 ENCOUNTER — Encounter: Payer: Self-pay | Admitting: Women's Health

## 2021-12-20 VITALS — BP 126/80 | HR 95 | Wt 165.4 lb

## 2021-12-20 DIAGNOSIS — O99013 Anemia complicating pregnancy, third trimester: Secondary | ICD-10-CM | POA: Diagnosis not present

## 2021-12-20 DIAGNOSIS — Z3403 Encounter for supervision of normal first pregnancy, third trimester: Secondary | ICD-10-CM

## 2021-12-20 DIAGNOSIS — Z3A34 34 weeks gestation of pregnancy: Secondary | ICD-10-CM

## 2021-12-20 LAB — POCT HEMOGLOBIN: Hemoglobin: 9.3 g/dL — AB (ref 11–14.6)

## 2021-12-20 NOTE — Patient Instructions (Signed)
Calla, thank you for choosing our office today! We appreciate the opportunity to meet your healthcare needs. You may receive a short survey by mail, e-mail, or through MyChart. If you are happy with your care we would appreciate if you could take just a few minutes to complete the survey questions. We read all of your comments and take your feedback very seriously. Thank you again for choosing our office.  [facility]  [facility] ([address] [place], [place] [address]) Entrance C, located off of [address] Free 24/7 valet parking   CLASSES: Go to [email or web address] to register for classes (childbirth, breastfeeding, waterbirth, infant CPR, daddy bootcamp, etc.)  Call the office ([phone]) or go to [facility] if: You begin to have strong, frequent contractions Your water breaks.  Sometimes it is a big gush of fluid, sometimes it is just a trickle that keeps getting your panties wet or running down your legs You have vaginal bleeding.  It is normal to have a small amount of spotting if your cervix was checked.  You don't feel your baby moving like normal.  If you don't, get you something to eat and drink and lay down and focus on feeling your baby move.   If your baby is still not moving like normal, you should call the office or go to [facility].  Call the office ([phone]) or [facility] for these signs of pre-eclampsia: Severe headache that does not go away with Tylenol Visual changes- seeing spots, double, blurred vision Pain under your right breast or upper abdomen that does not go away with Tums or heartburn medicine Nausea and/or vomiting Severe swelling in your hands, feet, and face   Tdap Vaccine It is recommended that you get the Tdap vaccine during the third trimester of EACH pregnancy to help protect your baby from getting pertussis (whooping cough) 27-36 weeks is the BEST time to do  this so that you can pass the protection on to your baby. During pregnancy is better than after pregnancy, but if you are unable to get it during pregnancy it will be offered at the hospital.  You can get this vaccine with us, at the health department, your family doctor, or some local pharmacies Everyone who will be around your baby should also be up-to-date on their vaccines before the baby comes. Adults (who are not pregnant) only need 1 dose of Tdap during adulthood.   [place] [facility] ([facility]): [address], [phone]           [facility]: [address], [phone]                [facility] ([facility]): [address], [phone] (call to ask if accepting patients) [facility]: [address], [place], [phone]    [place] Pediatricians/Family Doctors [facility] ([facility]): [address], [phone] [facility]: [address], [phone] [facility]: [address], [phone]  [place] [facility]  [facility] ([facility]): [phone] [facility]: [address], [phone]   [place] [facility]: [address], [phone]  [place] [facility]  [facility]: [address], [phone]  Home Blood Pressure Monitoring for Patients   Your provider has recommended that you check your   blood pressure (BP) at least once a week at home. If you do not have a blood pressure cuff at home, one will be provided for you. Contact your provider if you have not received your monitor within 1 week.   Helpful Tips for Accurate Home Blood Pressure Checks  Don't smoke, exercise, or drink caffeine 30 minutes before checking your BP Use the restroom before checking your BP (a full bladder can raise your  pressure) Relax in a comfortable upright chair Feet on the ground Left arm resting comfortably on a flat surface at the level of your heart Legs uncrossed Back supported Sit quietly and don't talk Place the cuff on your bare arm Adjust snuggly, so that only two fingertips can fit between your skin and the top of the cuff Check 2 readings separated by at least one minute Keep a log of your BP readings For a visual, please reference this diagram: http://ccnc.care/bpdiagram  Provider Name: [facility]     Phone: [phone]  Zone 1: ALL CLEAR  Continue to monitor your symptoms:  BP reading is less than 140 (top number) or less than 90 (bottom number)  No right upper stomach pain No headaches or seeing spots No feeling nauseated or throwing up No swelling in face and hands  Zone 2: CAUTION Call your doctor's office for any of the following:  BP reading is greater than 140 (top number) or greater than 90 (bottom number)  Stomach pain under your ribs in the middle or right side Headaches or seeing spots Feeling nauseated or throwing up Swelling in face and hands  Zone 3: EMERGENCY  Seek immediate medical care if you have any of the following:  BP reading is greater than160 (top number) or greater than 110 (bottom number) Severe headaches not improving with Tylenol Serious difficulty catching your breath Any worsening symptoms from Zone 2  Preterm Labor and Birth Information  The normal length of a pregnancy is 39-41 weeks. Preterm labor is when labor starts before 37 completed weeks of pregnancy. What are the risk factors for preterm labor? Preterm labor is more likely to occur in women who: Have certain infections during pregnancy such as a bladder infection, sexually transmitted infection, or infection inside the uterus (chorioamnionitis). Have a shorter-than-normal cervix. Have gone into preterm labor before. Have had surgery on their cervix. Are younger than age 17  or older than age 35. Are African American. Are pregnant with twins or multiple babies (multiple gestation). Take street drugs or smoke while pregnant. Do not gain enough weight while pregnant. Became pregnant shortly after having been pregnant. What are the symptoms of preterm labor? Symptoms of preterm labor include: Cramps similar to those that can happen during a menstrual period. The cramps may happen with diarrhea. Pain in the abdomen or lower back. Regular uterine contractions that may feel like tightening of the abdomen. A feeling of increased pressure in the pelvis. Increased watery or bloody mucus discharge from the vagina. Water breaking (ruptured amniotic sac). Why is it important to recognize signs of preterm labor? It is important to recognize signs of preterm labor because babies who are born prematurely may not be fully developed. This can put them at an increased risk for: Long-term (chronic) heart and lung problems. Difficulty immediately after birth with regulating body systems, including blood sugar, body temperature, heart rate, and breathing rate. Bleeding in the brain. Cerebral palsy. Learning difficulties. Death. These risks are highest for babies who are born before 34 weeks   of pregnancy. How is preterm labor treated? Treatment depends on the length of your pregnancy, your condition, and the health of your baby. It may involve: Having a stitch (suture) placed in your cervix to prevent your cervix from opening too early (cerclage). Taking or being given medicines, such as: Hormone medicines. These may be given early in pregnancy to help support the pregnancy. Medicine to stop contractions. Medicines to help mature the baby's lungs. These may be prescribed if the risk of delivery is high. Medicines to prevent your baby from developing cerebral palsy. If the labor happens before 34 weeks of pregnancy, you may need to stay in the hospital. What should I do if I  think I am in preterm labor? If you think that you are going into preterm labor, call your health care provider right away. How can I prevent preterm labor in future pregnancies? To increase your chance of having a full-term pregnancy: Do not use any tobacco products, such as cigarettes, chewing tobacco, and e-cigarettes. If you need help quitting, ask your health care provider. Do not use street drugs or medicines that have not been prescribed to you during your pregnancy. Talk with your health care provider before taking any herbal supplements, even if you have been taking them regularly. Make sure you gain a healthy amount of weight during your pregnancy. Watch for infection. If you think that you might have an infection, get it checked right away. Make sure to tell your health care provider if you have gone into preterm labor before. This information is not intended to replace advice given to you by your health care provider. Make sure you discuss any questions you have with your health care provider. Document Revised: 07/12/2018 Document Reviewed: 08/11/2015 Elsevier Patient Education  2020 Elsevier Inc.   

## 2021-12-20 NOTE — Progress Notes (Signed)
LOW-RISK PREGNANCY VISIT Patient name: Nicole Mckinney MRN [id number]  Date of birth: 2001-01-02 Chief Complaint:   Routine Prenatal Visit  History of Present Illness:   Nicole Mckinney is a 21 y.o. G63P0000 female at [redacted]w[redacted]d with an Estimated Date of Delivery: 01/27/22 being seen today for ongoing management of a low-risk pregnancy.   Today she reports fetal movements stronger, hurt some. S/P IV Venofer. Contractions: Not present. Vag. Bleeding: None.  Movement: Present. denies leaking of fluid.     11/01/2021    8:58 AM 07/18/2021    2:01 PM 08/01/2017    8:46 AM  Depression screen PHQ 2/9  Decreased Interest 0 0 0  Down, Depressed, Hopeless 0 0 0  PHQ - 2 Score 0 0 0  Altered sleeping 0 0   Tired, decreased energy 1 1   Change in appetite 0 0   Feeling bad or failure about yourself  0 0   Trouble concentrating 0 0   Moving slowly or fidgety/restless 0 0   Suicidal thoughts 0 0   PHQ-9 Score 1 1         11/01/2021    8:59 AM 07/18/2021    2:01 PM  GAD 7 : Generalized Anxiety Score  Nervous, Anxious, on Edge 1 1  Control/stop worrying 0 1  Worry too much - different things 1 1  Trouble relaxing 0 0  Restless 1 0  Easily annoyed or irritable 1 0  Afraid - awful might happen 0 0  Total GAD 7 Score 4 3      Review of Systems:   Pertinent items are noted in HPI Denies abnormal vaginal discharge w/ itching/odor/irritation, headaches, visual changes, shortness of breath, chest pain, abdominal pain, severe nausea/vomiting, or problems with urination or bowel movements unless otherwise stated above. Pertinent History Reviewed:  Reviewed past medical,surgical, social, obstetrical and family history.  Reviewed problem list, medications and allergies. Physical Assessment:   Vitals:   12/20/21 0845  BP: 126/80  Pulse: 95  Weight: 165 lb 6.4 oz (75 kg)  Body mass index is 27.52 kg/m.        Physical Examination:   General appearance: Well appearing, and in no  distress  Mental status: Alert, oriented to person, place, and time  Skin: Warm & dry  Cardiovascular: Normal heart rate noted  Respiratory: Normal respiratory effort, no distress  Abdomen: Soft, gravid, nontender  Pelvic: Cervical exam deferred         Extremities: Edema: None  Fetal Status: Fetal Heart Rate (bpm): 151 Fundal Height: 33 cm Movement: Present    Chaperone: N/A   Results for orders placed or performed in visit on 12/20/21 (from the past 24 hour(s))  POCT hemoglobin   Collection Time: 12/20/21  9:16 AM  Result Value Ref Range   Hemoglobin 9.3 (A) 11 - 14.6 g/dL    Assessment & Plan:  1) Low-risk pregnancy G1P0000 at [redacted]w[redacted]d with an Estimated Date of Delivery: 01/27/22   2) Anemia, s/p IV Venofer, hgb today 9.3   Meds: No orders of the defined types were placed in this encounter.  Labs/procedures today: fingerstick hgb  Plan:  Continue routine obstetrical care  Next visit: prefers will be in person for cultures     Reviewed: Preterm labor symptoms and general obstetric precautions including but not limited to vaginal bleeding, contractions, leaking of fluid and fetal movement were reviewed in detail with the patient.  All questions were answered. Does have home bp  cuff. Office bp cuff given: not applicable. Check bp weekly, let us know if consistently >140 and/or >90.  Follow-up: Return in about 2 weeks (around 01/03/2022) for [place], [place], in person.  No future appointments.  Orders Placed This Encounter  Procedures   POCT hemoglobin   [place], [facility] 12/20/2021 9:17 AM

## 2021-12-28 ENCOUNTER — Encounter: Payer: Self-pay | Admitting: Women's Health

## 2022-01-03 ENCOUNTER — Other Ambulatory Visit (HOSPITAL_COMMUNITY)
Admission: RE | Admit: 2022-01-03 | Discharge: 2022-01-03 | Disposition: A | Discharge status: Home / Self Care | Payer: BC Managed Care – PPO | Source: Ambulatory Visit | Attending: Women's Health | Admitting: Women's Health

## 2022-01-03 ENCOUNTER — Encounter: Payer: Self-pay | Admitting: Women's Health

## 2022-01-03 ENCOUNTER — Ambulatory Visit (INDEPENDENT_AMBULATORY_CARE_PROVIDER_SITE_OTHER): Payer: BC Managed Care – PPO | Admitting: Women's Health

## 2022-01-03 VITALS — BP 123/77 | HR 92 | Wt 168.5 lb

## 2022-01-03 DIAGNOSIS — O23593 Infection of other part of genital tract in pregnancy, third trimester: Secondary | ICD-10-CM | POA: Diagnosis not present

## 2022-01-03 DIAGNOSIS — Z3A36 36 weeks gestation of pregnancy: Secondary | ICD-10-CM

## 2022-01-03 DIAGNOSIS — N898 Other specified noninflammatory disorders of vagina: Secondary | ICD-10-CM | POA: Diagnosis present

## 2022-01-03 DIAGNOSIS — Z3403 Encounter for supervision of normal first pregnancy, third trimester: Secondary | ICD-10-CM

## 2022-01-03 DIAGNOSIS — O26893 Other specified pregnancy related conditions, third trimester: Secondary | ICD-10-CM | POA: Diagnosis present

## 2022-01-03 NOTE — Addendum Note (Signed)
Addended by: Jesusita Oka on: 01/03/2022 12:26 PM   Modules accepted: Orders

## 2022-01-03 NOTE — Progress Notes (Addendum)
LOW-RISK PREGNANCY VISIT Patient name: Nicole Mckinney MRN [id number]  Date of birth: Mar 18, 2001 Chief Complaint:   Routine Prenatal Visit  History of Present Illness:   Nicole Mckinney is a 21 y.o. G39P0000 female at [redacted]w[redacted]d with an Estimated Date of Delivery: 01/27/22 being seen today for ongoing management of a low-risk pregnancy.   Today she reports pelvic pressure, leg swelling. Some vaginal d/c, no itching/irritation/odor. Contractions: Not present. Vag. Bleeding: None.  Movement: Present. denies leaking of fluid.     11/01/2021    8:58 AM 07/18/2021    2:01 PM 08/01/2017    8:46 AM  Depression screen PHQ 2/9  Decreased Interest 0 0 0  Down, Depressed, Hopeless 0 0 0  PHQ - 2 Score 0 0 0  Altered sleeping 0 0   Tired, decreased energy 1 1   Change in appetite 0 0   Feeling bad or failure about yourself  0 0   Trouble concentrating 0 0   Moving slowly or fidgety/restless 0 0   Suicidal thoughts 0 0   PHQ-9 Score 1 1         11/01/2021    8:59 AM 07/18/2021    2:01 PM  GAD 7 : Generalized Anxiety Score  Nervous, Anxious, on Edge 1 1  Control/stop worrying 0 1  Worry too much - different things 1 1  Trouble relaxing 0 0  Restless 1 0  Easily annoyed or irritable 1 0  Afraid - awful might happen 0 0  Total GAD 7 Score 4 3      Review of Systems:   Pertinent items are noted in HPI Denies abnormal vaginal discharge w/ itching/odor/irritation, headaches, visual changes, shortness of breath, chest pain, abdominal pain, severe nausea/vomiting, or problems with urination or bowel movements unless otherwise stated above. Pertinent History Reviewed:  Reviewed past medical,surgical, social, obstetrical and family history.  Reviewed problem list, medications and allergies. Physical Assessment:   Vitals:   01/03/22 0912  BP: 123/77  Pulse: 92  Weight: 168 lb 8 oz (76.4 kg)  Body mass index is 28.04 kg/m.        Physical Examination:   General appearance: Well  appearing, and in no distress  Mental status: Alert, oriented to person, place, and time  Skin: Warm & dry  Cardiovascular: Normal heart rate noted  Respiratory: Normal respiratory effort, no distress  Abdomen: Soft, gravid, nontender  Pelvic: Cervical exam performed thin vaginal d/c Dilation: Closed Effacement (%): Thick Station: Ballotable  Extremities: Edema: Trace  Fetal Status: Fetal Heart Rate (bpm): 154 Fundal Height: 36 cm Movement: Present Presentation: Vertex  Chaperone: Calandra Johnson   No results found for this or any previous visit (from the past 24 hour(s)).  Assessment & Plan:  1) Low-risk pregnancy G1P0000 at [redacted]w[redacted]d with an Estimated Date of Delivery: 01/27/22   2) Anemia, s/p IV Venofer, hgb 8.1 to 9.3  3) Vag d/c> will add bv/yeast/trich to gc/ct CV swab   Meds: No orders of the defined types were placed in this encounter.  Labs/procedures today: GBS, GC/CT, and SVE  Plan:  Continue routine obstetrical care  Next visit: prefers in person    Reviewed: Preterm labor symptoms and general obstetric precautions including but not limited to vaginal bleeding, contractions, leaking of fluid and fetal movement were reviewed in detail with the patient.  All questions were answered. Does have home bp cuff. Office bp cuff given: not applicable. Check bp weekly, let us know if  consistently >140 and/or >90.  Follow-up: Return for weekly, As scheduled.  Future Appointments  Date Time Provider Department Center  01/10/2022  8:50 AM Cheral Marker, CNM [facility]  01/17/2022  8:50 AM Cheral Marker, CNM [facility]  01/24/2022  8:50 AM Cheral Marker, CNM [facility]    Orders Placed This Encounter  Procedures   Culture, beta strep (group b only)   Cheral Marker CNM, [facility] 01/03/2022 9:48 AM

## 2022-01-03 NOTE — Patient Instructions (Signed)
Nicole Mckinney, thank you for choosing our office today! We appreciate the opportunity to meet your healthcare needs. You may receive a short survey by mail, e-mail, or through [employer]. If you are happy with your care we would appreciate if you could take just a few minutes to complete the survey questions. We read all of your comments and take your feedback very seriously. Thank you again for choosing our office.  Center for [employer] Team at [place] at [facility] ([place], [place] [address]) Entrance C, located off of [address] parking   CLASSES: Go to [email or web address] to register for classes (childbirth, breastfeeding, waterbirth, infant CPR, daddy bootcamp, etc.)  Call the office [phone]) or go to [facility] if: You begin to have strong, frequent contractions Your water breaks.  Sometimes it is a big gush of fluid, sometimes it is just a trickle that keeps getting your panties wet or running down your legs You have vaginal bleeding.  It is normal to have a small amount of spotting if your cervix was checked.  You don't feel your baby moving like normal.  If you don't, get you something to eat and drink and lay down and focus on feeling your baby move.   If your baby is still not moving like normal, you should call the office or go to [facility].  Call the office [phone]) or go to [facility] hospital for these signs of pre-eclampsia: Severe headache that does not go away with Tylenol Visual changes- seeing spots, double, blurred vision Pain under your right breast or upper abdomen that does not go away with Tums or heartburn medicine Nausea and/or vomiting Severe swelling in your hands, feet, and face   [facility] Pediatricians/Family Doctors [facility]): [address] [place], [place]: [address] [place], [facility]): [place], [phone] (call to ask if accepting patients) [facility] Department: [address], [place], [facility]): [address], [place] [facility]: [address] [place], [place] [facility]: [place], [facility]): [phone] [facility]: [address], [place]: [address], [place] [facility]: [phone], [phone]  Home Blood Pressure Monitoring for Patients   Your provider has recommended that you check your blood pressure (BP) at least once a week at home. If you do not have a blood pressure cuff at home, one will be provided for you. Contact your provider if you have not received your monitor within 1 week.   Helpful Tips for Accurate Home Blood Pressure Checks  Don't smoke, exercise, or drink caffeine 30 minutes before checking your BP Use the restroom before checking your BP (a full bladder can raise your pressure) Relax in a comfortable upright chair Feet on the ground Left arm resting comfortably on a flat surface at the level of your heart Legs uncrossed Back supported Sit quietly and don't talk Place the cuff on your bare arm Adjust snuggly, so that only two fingertips  can fit between your skin and the top of the cuff Check 2 readings separated by at least one minute Keep a log of your BP readings For a visual, please reference this diagram: http://ccnc.care/bpdiagram  Provider Name: [facility]     Phone: [phone]  Zone 1: ALL CLEAR  Continue to monitor your symptoms:  BP reading is less than 140 (top number) or less than 90 (bottom number)  No right  upper stomach pain No headaches or seeing spots No feeling nauseated or throwing up No swelling in face and hands  Zone 2: CAUTION Call your doctor's office for any of the following:  BP reading is greater than 140 (top number) or greater than 90 (bottom number)  Stomach pain under your ribs in the middle or right side Headaches or seeing spots Feeling nauseated or throwing up Swelling in face and hands  Zone 3: EMERGENCY  Seek immediate medical care if you have any of the following:  BP reading is greater than160 (top number) or greater than 110 (bottom number) Severe headaches not improving with Tylenol Serious difficulty catching your breath Any worsening symptoms from Zone 2   Braxton Hicks Contractions Contractions of the uterus can occur throughout pregnancy, but they are not always a sign that you are in labor. You may have practice contractions called Braxton Hicks contractions. These false labor contractions are sometimes confused with true labor. What are [place] contractions? Braxton Hicks contractions are tightening movements that occur in the muscles of the uterus before labor. Unlike true labor contractions, these contractions do not result in opening (dilation) and thinning of the cervix. Toward the end of pregnancy (32-34 weeks), Braxton Hicks contractions can happen more often and may become stronger. These contractions are sometimes difficult to tell apart from true labor because they can be very uncomfortable. You should not feel embarrassed if you go to the hospital with false labor. Sometimes, the only way to tell if you are in true labor is for your health care provider to look for changes in the cervix. The health care provider will do a physical exam and may monitor your contractions. If you are not in true labor, the exam should show that your cervix is not dilating and your water has not broken. If there are no other health problems associated with your  pregnancy, it is completely safe for you to be sent home with false labor. You may continue to have Braxton Hicks contractions until you go into true labor. How to tell the difference between true labor and false labor True labor Contractions last 30-70 seconds. Contractions become very regular. Discomfort is usually felt in the top of the uterus, and it spreads to the lower abdomen and low back. Contractions do not go away with walking. Contractions usually become more intense and increase in frequency. The cervix dilates and gets thinner. False labor Contractions are usually shorter and not as strong as true labor contractions. Contractions are usually irregular. Contractions are often felt in the front of the lower abdomen and in the groin. Contractions may go away when you walk around or change positions while lying down. Contractions get weaker and are shorter-lasting as time goes on. The cervix usually does not dilate or become thin. Follow these instructions at home:  Take over-the-counter and prescription medicines only as told by your health care provider. Keep up with your usual exercises and follow other instructions from your health care provider. Eat and drink lightly if you think  you are going into labor. If Braxton Hicks contractions are making you uncomfortable: Change your position from lying down or resting to walking, or change from walking to resting. Sit and rest in a tub of warm water. Drink enough fluid to keep your urine pale yellow. Dehydration may cause these contractions. Do slow and deep breathing several times an hour. Keep all follow-up prenatal visits as told by your health care provider. This is important. Contact a health care provider if: You have a fever. You have continuous pain in your abdomen. Get help right away if: Your contractions become stronger, more regular, and closer together. You have fluid leaking or gushing from your vagina. You pass  blood-tinged mucus (bloody show). You have bleeding from your vagina. You have low back pain that you never had before. You feel your baby's head pushing down and causing pelvic pressure. Your baby is not moving inside you as much as it used to. Summary Contractions that occur before labor are called Braxton Hicks contractions, false labor, or practice contractions. Braxton Hicks contractions are usually shorter, weaker, farther apart, and less regular than true labor contractions. True labor contractions usually become progressively stronger and regular, and they become more frequent. Manage discomfort from [facility] contractions by changing position, resting in a warm bath, drinking plenty of water, or practicing deep breathing. This information is not intended to replace advice given to you by your health care provider. Make sure you discuss any questions you have with your health care provider. Document Revised: 03/02/2017 Document Reviewed: 08/03/2016 Elsevier Patient Education  [place].

## 2022-01-04 LAB — CERVICOVAGINAL ANCILLARY ONLY
Bacterial Vaginitis (gardnerella): POSITIVE — AB
Candida Glabrata: NEGATIVE
Candida Vaginitis: NEGATIVE
Chlamydia: NEGATIVE
Comment: NEGATIVE
Comment: NEGATIVE
Comment: NEGATIVE
Comment: NEGATIVE
Comment: NEGATIVE
Comment: NORMAL
Neisseria Gonorrhea: NEGATIVE
Trichomonas: NEGATIVE

## 2022-01-06 LAB — CULTURE, BETA STREP (GROUP B ONLY): Strep Gp B Culture: POSITIVE — AB

## 2022-01-09 MED ORDER — METRONIDAZOLE 500 MG PO TABS: 500.0000 mg | ORAL_TABLET | Freq: Two times a day (BID) | ORAL | 0 refills | Status: DC

## 2022-01-09 NOTE — Addendum Note (Signed)
Addended by: Wells Guiles R on: 01/09/2022 11:53 AM   Modules accepted: Orders

## 2022-01-10 ENCOUNTER — Encounter: Payer: Self-pay | Admitting: Women's Health

## 2022-01-10 ENCOUNTER — Ambulatory Visit (INDEPENDENT_AMBULATORY_CARE_PROVIDER_SITE_OTHER): Payer: BC Managed Care – PPO | Admitting: Women's Health

## 2022-01-10 VITALS — BP 130/82 | HR 95 | Wt 172.0 lb

## 2022-01-10 DIAGNOSIS — Z3403 Encounter for supervision of normal first pregnancy, third trimester: Secondary | ICD-10-CM

## 2022-01-10 NOTE — Patient Instructions (Signed)
Verneta, thank you for choosing our office today! We appreciate the opportunity to meet your healthcare needs. You may receive a short survey by mail, e-mail, or through MyChart. If you are happy with your care we would appreciate if you could take just a few minutes to complete the survey questions. We read all of your comments and take your feedback very seriously. Thank you again for choosing our office.  [facility]  [facility] ([address] [place], [place] [address]) Entrance C, located off of [address] Free 24/7 valet parking   CLASSES: Go to [email or web address] to register for classes (childbirth, breastfeeding, waterbirth, infant CPR, daddy bootcamp, etc.)  Call the office ([phone]) or go to [facility] if: You begin to have strong, frequent contractions Your water breaks.  Sometimes it is a big gush of fluid, sometimes it is just a trickle that keeps getting your panties wet or running down your legs You have vaginal bleeding.  It is normal to have a small amount of spotting if your cervix was checked.  You don't feel your baby moving like normal.  If you don't, get you something to eat and drink and lay down and focus on feeling your baby move.   If your baby is still not moving like normal, you should call the office or go to [facility].  Call the office ([phone]) or go to [facility] for these signs of pre-eclampsia: Severe headache that does not go away with Tylenol Visual changes- seeing spots, double, blurred vision Pain under your right breast or upper abdomen that does not go away with Tums or heartburn medicine Nausea and/or vomiting Severe swelling in your hands, feet, and face   [place] [facility]/Family Doctors [facility] ([facility]): [address], [phone]           [facility]: [address], [phone]                 [facility] ([facility]): [address], [phone] (call to ask if accepting patients) [facility]: [address], [place], [phone]    [place] Pediatric[facility]/Family Doctors [facility] ([facility]): [address], [phone] [facility]: [address], [phone] [facility]: [address], [phone]  [place] [facility]  [facility] ([facility]): [phone] [facility]: [address], [phone]   [place] [facility]: [address], [phone]  [place] [facility]  [facility]: [address], [phone]  Home Blood Pressure Monitoring for Patients   Your provider has recommended that you check your blood pressure (BP) at least once a week at home. If you do not have a blood pressure cuff at home, one will be provided for you. Contact your provider if you have not received your monitor within 1 week.   Helpful Tips for Accurate Home Blood Pressure Checks  Don't smoke, exercise, or drink caffeine 30 minutes before checking your BP Use the restroom before checking your BP (a full bladder can raise your pressure) Relax in a comfortable upright chair Feet on the ground Left arm resting comfortably on a flat surface at the level of your heart Legs uncrossed Back supported Sit quietly and don't talk Place the cuff on your bare arm Adjust snuggly, so that only two fingertips   can fit between your skin and the top of the cuff Check 2 readings separated by at least one minute Keep a log of your BP readings For a visual, please reference this diagram: http://ccnc.care/bpdiagram  Provider Name: [facility]     Phone: [phone]  Zone 1: ALL CLEAR  Continue to monitor your symptoms:  BP reading is less than 140 (top number) or less than 90 (bottom number)  No right  upper stomach pain No headaches or seeing spots No feeling nauseated or throwing up No swelling in face and hands  Zone 2: CAUTION Call your doctor's office for any of the following:  BP reading is greater than 140 (top number) or greater than 90 (bottom number)  Stomach pain under your ribs in the middle or right side Headaches or seeing spots Feeling nauseated or throwing up Swelling in face and hands  Zone 3: EMERGENCY  Seek immediate medical care if you have any of the following:  BP reading is greater than160 (top number) or greater than 110 (bottom number) Severe headaches not improving with Tylenol Serious difficulty catching your breath Any worsening symptoms from Zone 2   Braxton Hicks Contractions Contractions of the uterus can occur throughout pregnancy, but they are not always a sign that you are in labor. You may have practice contractions called Braxton Hicks contractions. These false labor contractions are sometimes confused with true labor. What are [place] contractions? Braxton Hicks contractions are tightening movements that occur in the muscles of the uterus before labor. Unlike true labor contractions, these contractions do not result in opening (dilation) and thinning of the cervix. Toward the end of pregnancy (32-34 weeks), Braxton Hicks contractions can happen more often and may become stronger. These contractions are sometimes difficult to tell apart from true labor because they can be very uncomfortable. You should not feel embarrassed if you go to the hospital with false labor. Sometimes, the only way to tell if you are in true labor is for your health care provider to look for changes in the cervix. The health care provider will do a physical exam and may monitor your contractions. If you are not in true labor, the exam should show that your cervix is not dilating and your water has not broken. If there are no other health problems associated with your  pregnancy, it is completely safe for you to be sent home with false labor. You may continue to have Braxton Hicks contractions until you go into true labor. How to tell the difference between true labor and false labor True labor Contractions last 30-70 seconds. Contractions become very regular. Discomfort is usually felt in the top of the uterus, and it spreads to the lower abdomen and low back. Contractions do not go away with walking. Contractions usually become more intense and increase in frequency. The cervix dilates and gets thinner. False labor Contractions are usually shorter and not as strong as true labor contractions. Contractions are usually irregular. Contractions are often felt in the front of the lower abdomen and in the groin. Contractions may go away when you walk around or change positions while lying down. Contractions get weaker and are shorter-lasting as time goes on. The cervix usually does not dilate or become thin. Follow these instructions at home:  Take over-the-counter and prescription medicines only as told by your health care provider. Keep up with your usual exercises and follow other instructions from your health care provider. Eat and drink lightly if you think  you are going into labor. If Braxton Hicks contractions are making you uncomfortable: Change your position from lying down or resting to walking, or change from walking to resting. Sit and rest in a tub of warm water. Drink enough fluid to keep your urine pale yellow. Dehydration may cause these contractions. Do slow and deep breathing several times an hour. Keep all follow-up prenatal visits as told by your health care provider. This is important. Contact a health care provider if: You have a fever. You have continuous pain in your abdomen. Get help right away if: Your contractions become stronger, more regular, and closer together. You have fluid leaking or gushing from your vagina. You pass  blood-tinged mucus (bloody show). You have bleeding from your vagina. You have low back pain that you never had before. You feel your baby's head pushing down and causing pelvic pressure. Your baby is not moving inside you as much as it used to. Summary Contractions that occur before labor are called Braxton Hicks contractions, false labor, or practice contractions. Braxton Hicks contractions are usually shorter, weaker, farther apart, and less regular than true labor contractions. True labor contractions usually become progressively stronger and regular, and they become more frequent. Manage discomfort from [facility] contractions by changing position, resting in a warm bath, drinking plenty of water, or practicing deep breathing. This information is not intended to replace advice given to you by your health care provider. Make sure you discuss any questions you have with your health care provider. Document Revised: 03/02/2017 Document Reviewed: 08/03/2016 Elsevier Patient Education  [place].

## 2022-01-10 NOTE — Progress Notes (Signed)
LOW-RISK PREGNANCY VISIT Patient name: Nicole Mckinney MRN [id number]  Date of birth: 09/02/2000 Chief Complaint:   Routine Prenatal Visit  History of Present Illness:   Nicole Mckinney is a 21 y.o. G32P0000 female at [redacted]w[redacted]d with an Estimated Date of Delivery: 01/27/22 being seen today for ongoing management of a low-risk pregnancy.   Today she reports no complaints. Contractions: Not present. Vag. Bleeding: None.  Movement: Present. denies leaking of fluid.     11/01/2021    8:58 AM 07/18/2021    2:01 PM 08/01/2017    8:46 AM  Depression screen PHQ 2/9  Decreased Interest 0 0 0  Down, Depressed, Hopeless 0 0 0  PHQ - 2 Score 0 0 0  Altered sleeping 0 0   Tired, decreased energy 1 1   Change in appetite 0 0   Feeling bad or failure about yourself  0 0   Trouble concentrating 0 0   Moving slowly or fidgety/restless 0 0   Suicidal thoughts 0 0   PHQ-9 Score 1 1         11/01/2021    8:59 AM 07/18/2021    2:01 PM  GAD 7 : Generalized Anxiety Score  Nervous, Anxious, on Edge 1 1  Control/stop worrying 0 1  Worry too much - different things 1 1  Trouble relaxing 0 0  Restless 1 0  Easily annoyed or irritable 1 0  Afraid - awful might happen 0 0  Total GAD 7 Score 4 3      Review of Systems:   Pertinent items are noted in HPI Denies abnormal vaginal discharge w/ itching/odor/irritation, headaches, visual changes, shortness of breath, chest pain, abdominal pain, severe nausea/vomiting, or problems with urination or bowel movements unless otherwise stated above. Pertinent History Reviewed:  Reviewed past medical,surgical, social, obstetrical and family history.  Reviewed problem list, medications and allergies. Physical Assessment:   Vitals:   01/10/22 0858  BP: 130/82  Pulse: 95  Weight: 172 lb (78 kg)  Body mass index is 28.62 kg/m.        Physical Examination:   General appearance: Well appearing, and in no distress  Mental status: Alert, oriented to person,  place, and time  Skin: Warm & dry  Cardiovascular: Normal heart rate noted  Respiratory: Normal respiratory effort, no distress  Abdomen: Soft, gravid, nontender  Pelvic: Cervical exam deferred         Extremities: Edema: Trace  Fetal Status: Fetal Heart Rate (bpm): 148 Fundal Height: 36 cm Movement: Present Presentation: Vertex  Chaperone: N/A   No results found for this or any previous visit (from the past 24 hour(s)).  Assessment & Plan:  1) Low-risk pregnancy G1P0000 at [redacted]w[redacted]d with an Estimated Date of Delivery: 01/27/22   2) Borderline bp, asymptomatic, start checking bp daily at home, if >140/90 or sx, call us/go to [facility]   Meds: No orders of the defined types were placed in this encounter.  Labs/procedures today: none  Plan:  Continue routine obstetrical care  Next visit: prefers in person    Reviewed: Term labor symptoms and general obstetric precautions including but not limited to vaginal bleeding, contractions, leaking of fluid and fetal movement were reviewed in detail with the patient.  All questions were answered. Does have home bp cuff. Office bp cuff given: not applicable. Check bp daily, let us know if consistently >140 and/or >90.  Follow-up: Return for weekly, As scheduled.  Future Appointments  Date Time Provider  Department Center  01/17/2022  8:50 AM Cheral Marker, CNM [facility]  01/24/2022  8:50 AM Cheral Marker, CNM [facility]    No orders of the defined types were placed in this encounter.  Cheral Marker CNM, [facility] 01/10/2022 9:35 AM

## 2022-01-17 ENCOUNTER — Encounter: Payer: Self-pay | Admitting: Women's Health

## 2022-01-17 ENCOUNTER — Ambulatory Visit (INDEPENDENT_AMBULATORY_CARE_PROVIDER_SITE_OTHER): Payer: BC Managed Care – PPO | Admitting: Women's Health

## 2022-01-17 VITALS — BP 130/81 | HR 112 | Wt 173.6 lb

## 2022-01-17 DIAGNOSIS — Z3A38 38 weeks gestation of pregnancy: Secondary | ICD-10-CM

## 2022-01-17 DIAGNOSIS — Z3403 Encounter for supervision of normal first pregnancy, third trimester: Secondary | ICD-10-CM

## 2022-01-17 NOTE — Progress Notes (Signed)
LOW-RISK PREGNANCY VISIT Patient name: Nicole Mckinney MRN [id number]  Date of birth: 03/08/2001 Chief Complaint:   Routine Prenatal Visit  History of Present Illness:   Nicole Mckinney is a 21 y.o. G18P0000 female at [redacted]w[redacted]d with an Estimated Date of Delivery: 01/27/22 being seen today for ongoing management of a low-risk pregnancy.   Today she reports occasional contractions. Contractions: Irritability. Vag. Bleeding: None.  Movement: Present. denies leaking of fluid.     11/01/2021    8:58 AM 07/18/2021    2:01 PM 08/01/2017    8:46 AM  Depression screen PHQ 2/9  Decreased Interest 0 0 0  Down, Depressed, Hopeless 0 0 0  PHQ - 2 Score 0 0 0  Altered sleeping 0 0   Tired, decreased energy 1 1   Change in appetite 0 0   Feeling bad or failure about yourself  0 0   Trouble concentrating 0 0   Moving slowly or fidgety/restless 0 0   Suicidal thoughts 0 0   PHQ-9 Score 1 1         11/01/2021    8:59 AM 07/18/2021    2:01 PM  GAD 7 : Generalized Anxiety Score  Nervous, Anxious, on Edge 1 1  Control/stop worrying 0 1  Worry too much - different things 1 1  Trouble relaxing 0 0  Restless 1 0  Easily annoyed or irritable 1 0  Afraid - awful might happen 0 0  Total GAD 7 Score 4 3      Review of Systems:   Pertinent items are noted in HPI Denies abnormal vaginal discharge w/ itching/odor/irritation, headaches, visual changes, shortness of breath, chest pain, abdominal pain, severe nausea/vomiting, or problems with urination or bowel movements unless otherwise stated above. Pertinent History Reviewed:  Reviewed past medical,surgical, social, obstetrical and family history.  Reviewed problem list, medications and allergies. Physical Assessment:   Vitals:   01/17/22 0901  BP: 130/81  Pulse: (!) 112  Weight: 173 lb 9.6 oz (78.7 kg)  Body mass index is 28.89 kg/m.        Physical Examination:   General appearance: Well appearing, and in no distress  Mental status:  Alert, oriented to person, place, and time  Skin: Warm & dry  Cardiovascular: Normal heart rate noted  Respiratory: Normal respiratory effort, no distress  Abdomen: Soft, gravid, nontender  Pelvic: Cervical exam deferred         Extremities: Edema: Trace  Fetal Status: Fetal Heart Rate (bpm): 160 Fundal Height: 37 cm Movement: Present    Chaperone: N/A   No results found for this or any previous visit (from the past 24 hour(s)).  Assessment & Plan:  1) Low-risk pregnancy G1P0000 at [redacted]w[redacted]d with an Estimated Date of Delivery: 01/27/22   2) Anemia, s/p IV venofer, last hemoglobin 9.3   Meds: No orders of the defined types were placed in this encounter.  Labs/procedures today: none  Plan:  Continue routine obstetrical care  Next visit: prefers in person    Reviewed: Term labor symptoms and general obstetric precautions including but not limited to vaginal bleeding, contractions, leaking of fluid and fetal movement were reviewed in detail with the patient.  All questions were answered. Does have home bp cuff. Office bp cuff given: not applicable. Check bp weekly, let us know if consistently >140 and/or >90.  Follow-up: Return for weekly, As scheduled.  Future Appointments  Date Time Provider Department Center  01/24/2022  8:50 AM Grace Bushy,  Royetta Crochet, CNM [facility]    No orders of the defined types were placed in this encounter.  Roma Schanz CNM, [facility] 01/17/2022 9:12 AM

## 2022-01-17 NOTE — Patient Instructions (Signed)
[place], thank you for choosing our office today! We appreciate the opportunity to meet your healthcare needs. You may receive a short survey by mail, e-mail, or through MyChart. If you are happy with your care we would appreciate if you could take just a few minutes to complete the survey questions. We read all of your comments and take your feedback very seriously. Thank you again for choosing our office.  [facility]  [facility] ([address] [place], [place] [address]) Entrance C, located off of [address] Free 24/7 valet parking   CLASSES: Go to [email or web address] to register for classes (childbirth, breastfeeding, waterbirth, infant CPR, daddy bootcamp, etc.)  Call the office ([phone]) or go to [facility] if: You begin to have strong, frequent contractions Your water breaks.  Sometimes it is a big gush of fluid, sometimes it is just a trickle that keeps getting your panties wet or running down your legs You have vaginal bleeding.  It is normal to have a small amount of spotting if your cervix was checked.  You don't feel your baby moving like normal.  If you don't, get you something to eat and drink and lay down and focus on feeling your baby move.   If your baby is still not moving like normal, you should call the office or go to [facility].  Call the office ([phone]) or go to [facility] for these signs of pre-eclampsia: Severe headache that does not go away with Tylenol Visual changes- seeing spots, double, blurred vision Pain under your right breast or upper abdomen that does not go away with Tums or heartburn medicine Nausea and/or vomiting Severe swelling in your hands, feet, and face   [place] [facility] Doctors [facility] ([facility]): [address], [phone]           [facility]: [address], [phone]                 [facility] ([facility]): [address], [phone] (call to ask if accepting patients) [facility]: [address], [place], [phone]    [place] Pediatricians/Family Doctors [facility] ([facility]): [address], [phone] [facility]: [address], [phone] [facility]: [address], [phone]  [place] [facility]  [facility] ([facility]): [phone] [facility]: [address], [phone]   [place] [facility]: [address], [phone]  [place] [facility]  [facility]: [address], [phone]  Home Blood Pressure Monitoring for Patients   Your provider has recommended that you check your blood pressure (BP) at least once a week at home. If you do not have a blood pressure cuff at home, one will be provided for you. Contact your provider if you have not received your monitor within 1 week.   Helpful Tips for Accurate Home Blood Pressure Checks  Don't smoke, exercise, or drink caffeine 30 minutes before checking your BP Use the restroom before checking your BP (a full bladder can raise your pressure) Relax in a comfortable upright chair Feet on the ground Left arm resting comfortably on a flat surface at the level of your heart Legs uncrossed Back supported Sit quietly and don't talk Place the cuff on your bare arm Adjust snuggly, so that only two fingertips   can fit between your skin and the top of the cuff Check 2 readings separated by at least one minute Keep a log of your BP readings For a visual, please reference this diagram: http://ccnc.care/bpdiagram  Provider Name: [facility]     Phone: [phone]  Zone 1: ALL CLEAR  Continue to monitor your symptoms:  BP reading is less than 140 (top number) or less than 90 (bottom number)  No right  upper stomach pain No headaches or seeing spots No feeling nauseated or throwing up No swelling in face and hands  Zone 2: CAUTION Call your doctor's office for any of the following:  BP reading is greater than 140 (top number) or greater than 90 (bottom number)  Stomach pain under your ribs in the middle or right side Headaches or seeing spots Feeling nauseated or throwing up Swelling in face and hands  Zone 3: EMERGENCY  Seek immediate medical care if you have any of the following:  BP reading is greater than160 (top number) or greater than 110 (bottom number) Severe headaches not improving with Tylenol Serious difficulty catching your breath Any worsening symptoms from Zone 2   Braxton Hicks Contractions Contractions of the uterus can occur throughout pregnancy, but they are not always a sign that you are in labor. You may have practice contractions called Braxton Hicks contractions. These false labor contractions are sometimes confused with true labor. What are [place] contractions? Braxton Hicks contractions are tightening movements that occur in the muscles of the uterus before labor. Unlike true labor contractions, these contractions do not result in opening (dilation) and thinning of the cervix. Toward the end of pregnancy (32-34 weeks), Braxton Hicks contractions can happen more often and may become stronger. These contractions are sometimes difficult to tell apart from true labor because they can be very uncomfortable. You should not feel embarrassed if you go to the hospital with false labor. Sometimes, the only way to tell if you are in true labor is for your health care provider to look for changes in the cervix. The health care provider will do a physical exam and may monitor your contractions. If you are not in true labor, the exam should show that your cervix is not dilating and your water has not broken. If there are no other health problems associated with your  pregnancy, it is completely safe for you to be sent home with false labor. You may continue to have Braxton Hicks contractions until you go into true labor. How to tell the difference between true labor and false labor True labor Contractions last 30-70 seconds. Contractions become very regular. Discomfort is usually felt in the top of the uterus, and it spreads to the lower abdomen and low back. Contractions do not go away with walking. Contractions usually become more intense and increase in frequency. The cervix dilates and gets thinner. False labor Contractions are usually shorter and not as strong as true labor contractions. Contractions are usually irregular. Contractions are often felt in the front of the lower abdomen and in the groin. Contractions may go away when you walk around or change positions while lying down. Contractions get weaker and are shorter-lasting as time goes on. The cervix usually does not dilate or become thin. Follow these instructions at home:  Take over-the-counter and prescription medicines only as told by your health care provider. Keep up with your usual exercises and follow other instructions from your health care provider. Eat and drink lightly if you think  you are going into labor. If Braxton Hicks contractions are making you uncomfortable: Change your position from lying down or resting to walking, or change from walking to resting. Sit and rest in a tub of warm water. Drink enough fluid to keep your urine pale yellow. Dehydration may cause these contractions. Do slow and deep breathing several times an hour. Keep all follow-up prenatal visits as told by your health care provider. This is important. Contact a health care provider if: You have a fever. You have continuous pain in your abdomen. Get help right away if: Your contractions become stronger, more regular, and closer together. You have fluid leaking or gushing from your vagina. You pass  blood-tinged mucus (bloody show). You have bleeding from your vagina. You have low back pain that you never had before. You feel your baby's head pushing down and causing pelvic pressure. Your baby is not moving inside you as much as it used to. Summary Contractions that occur before labor are called Braxton Hicks contractions, false labor, or practice contractions. Braxton Hicks contractions are usually shorter, weaker, farther apart, and less regular than true labor contractions. True labor contractions usually become progressively stronger and regular, and they become more frequent. Manage discomfort from [facility] contractions by changing position, resting in a warm bath, drinking plenty of water, or practicing deep breathing. This information is not intended to replace advice given to you by your health care provider. Make sure you discuss any questions you have with your health care provider. Document Revised: 03/02/2017 Document Reviewed: 08/03/2016 Elsevier Patient Education  [place].

## 2022-01-24 ENCOUNTER — Ambulatory Visit (INDEPENDENT_AMBULATORY_CARE_PROVIDER_SITE_OTHER): Payer: BC Managed Care – PPO | Admitting: Women's Health

## 2022-01-24 ENCOUNTER — Encounter: Payer: Self-pay | Admitting: Women's Health

## 2022-01-24 VITALS — BP 131/84 | HR 106 | Wt 175.0 lb

## 2022-01-24 DIAGNOSIS — Z3A39 39 weeks gestation of pregnancy: Secondary | ICD-10-CM

## 2022-01-24 DIAGNOSIS — Z3483 Encounter for supervision of other normal pregnancy, third trimester: Secondary | ICD-10-CM

## 2022-01-24 NOTE — Patient Instructions (Signed)
Nicole Mckinney, thank you for choosing our office today! We appreciate the opportunity to meet your healthcare needs. You may receive a short survey by mail, e-mail, or through MyChart. If you are happy with your care we would appreciate if you could take just a few minutes to complete the survey questions. We read all of your comments and take your feedback very seriously. Thank you again for choosing our office.  [facility]  [facility] ([address] [place], [place] [address]) Entrance C, located off of [address] Free 24/7 valet parking   CLASSES: Go to [email or web address] to register for classes (childbirth, breastfeeding, waterbirth, infant CPR, daddy bootcamp, etc.)  Call the office ([phone]) or go to [facility] if: You begin to have strong, frequent contractions Your water breaks.  Sometimes it is a big gush of fluid, sometimes it is just a trickle that keeps getting your panties wet or running down your legs You have vaginal bleeding.  It is normal to have a small amount of spotting if your cervix was checked.  You don't feel your baby moving like normal.  If you don't, get you something to eat and drink and lay down and focus on feeling your baby move.   If your baby is still not moving like normal, you should call the office or go to [facility].  Call the office ([phone]) or [facility] for these signs of pre-eclampsia: Severe headache that does not go away with Tylenol Visual changes- seeing spots, double, blurred vision Pain under your right breast or upper abdomen that does not go away with Tums or heartburn medicine Nausea and/or vomiting Severe swelling in your hands, feet, and face   [place] Pediatricians/Family Doctors [facility] ([facility]): [address], [phone]           [facility]: [address], [phone]                 [facility] ([facility]): [address], [phone] (call to ask if accepting patients) [facility]: [address], [place], [phone]    [place] Pediatricians/Family Doctors [facility] ([facility]): [address], [phone] [facility]: [address], [phone] [facility]: [address], [phone]  [place] [facility]  [facility] ([facility]): [phone] [facility]: [address], [phone]   [place] [facility]: [address], [phone]  [place] [facility]  [facility]: [address], [phone]  Home Blood Pressure Monitoring for Patients   Your provider has recommended that you check your blood pressure (BP) at least once a week at home. If you do not have a blood pressure cuff at home, one will be provided for you. Contact your provider if you have not received your monitor within 1 week.   Helpful Tips for Accurate Home Blood Pressure Checks  Don't smoke, exercise, or drink caffeine 30 minutes before checking your BP Use the restroom before checking your BP (a full bladder can raise your pressure) Relax in a comfortable upright chair Feet on the ground Left arm resting comfortably on a flat surface at the level of your heart Legs uncrossed Back supported Sit quietly and don't talk Place the cuff on your bare arm Adjust snuggly, so that only two fingertips   can fit between your skin and the top of the cuff Check 2 readings separated by at least one minute Keep a log of your BP readings For a visual, please reference this diagram: http://ccnc.care/bpdiagram  Provider Name: [facility]     Phone: [phone]  Zone 1: ALL CLEAR  Continue to monitor your symptoms:  BP reading is less than 140 (top number) or less than 90 (bottom number)  No right  upper stomach pain No headaches or seeing spots No feeling nauseated or throwing up No swelling in face and hands  Zone 2: CAUTION Call your doctor's office for any of the following:  BP reading is greater than 140 (top number) or greater than 90 (bottom number)  Stomach pain under your ribs in the middle or right side Headaches or seeing spots Feeling nauseated or throwing up Swelling in face and hands  Zone 3: EMERGENCY  Seek immediate medical care if you have any of the following:  BP reading is greater than160 (top number) or greater than 110 (bottom number) Severe headaches not improving with Tylenol Serious difficulty catching your breath Any worsening symptoms from Zone 2   Braxton Hicks Contractions Contractions of the uterus can occur throughout pregnancy, but they are not always a sign that you are in labor. You may have practice contractions called Braxton Hicks contractions. These false labor contractions are sometimes confused with true labor. What are [place] contractions? Braxton Hicks contractions are tightening movements that occur in the muscles of the uterus before labor. Unlike true labor contractions, these contractions do not result in opening (dilation) and thinning of the cervix. Toward the end of pregnancy (32-34 weeks), Braxton Hicks contractions can happen more often and may become stronger. These contractions are sometimes difficult to tell apart from true labor because they can be very uncomfortable. You should not feel embarrassed if you go to the hospital with false labor. Sometimes, the only way to tell if you are in true labor is for your health care provider to look for changes in the cervix. The health care provider will do a physical exam and may monitor your contractions. If you are not in true labor, the exam should show that your cervix is not dilating and your water has not broken. If there are no other health problems associated with your  pregnancy, it is completely safe for you to be sent home with false labor. You may continue to have Braxton Hicks contractions until you go into true labor. How to tell the difference between true labor and false labor True labor Contractions last 30-70 seconds. Contractions become very regular. Discomfort is usually felt in the top of the uterus, and it spreads to the lower abdomen and low back. Contractions do not go away with walking. Contractions usually become more intense and increase in frequency. The cervix dilates and gets thinner. False labor Contractions are usually shorter and not as strong as true labor contractions. Contractions are usually irregular. Contractions are often felt in the front of the lower abdomen and in the groin. Contractions may go away when you walk around or change positions while lying down. Contractions get weaker and are shorter-lasting as time goes on. The cervix usually does not dilate or become thin. Follow these instructions at home:  Take over-the-counter and prescription medicines only as told by your health care provider. Keep up with your usual exercises and follow other instructions from your health care provider. Eat and drink lightly if you think  you are going into labor. If Braxton Hicks contractions are making you uncomfortable: Change your position from lying down or resting to walking, or change from walking to resting. Sit and rest in a tub of warm water. Drink enough fluid to keep your urine pale yellow. Dehydration may cause these contractions. Do slow and deep breathing several times an hour. Keep all follow-up prenatal visits as told by your health care provider. This is important. Contact a health care provider if: You have a fever. You have continuous pain in your abdomen. Get help right away if: Your contractions become stronger, more regular, and closer together. You have fluid leaking or gushing from your vagina. You pass  blood-tinged mucus (bloody show). You have bleeding from your vagina. You have low back pain that you never had before. You feel your baby's head pushing down and causing pelvic pressure. Your baby is not moving inside you as much as it used to. Summary Contractions that occur before labor are called Braxton Hicks contractions, false labor, or practice contractions. Braxton Hicks contractions are usually shorter, weaker, farther apart, and less regular than true labor contractions. True labor contractions usually become progressively stronger and regular, and they become more frequent. Manage discomfort from [facility] contractions by changing position, resting in a warm bath, drinking plenty of water, or practicing deep breathing. This information is not intended to replace advice given to you by your health care provider. Make sure you discuss any questions you have with your health care provider. Document Revised: 03/02/2017 Document Reviewed: 08/03/2016 Elsevier Patient Education  [place].

## 2022-01-24 NOTE — Progress Notes (Signed)
LOW-RISK PREGNANCY VISIT Patient name: Nicole Mckinney MRN [id number]  Date of birth: 10-17-2000 Chief Complaint:   Routine Prenatal Visit  History of Present Illness:   Nicole Mckinney is a 21 y.o. G32P0000 female at [redacted]w[redacted]d with an Estimated Date of Delivery: 01/27/22 being seen today for ongoing management of a low-risk pregnancy.   Today she reports no complaints. Checks home bp's daily, 120s/70s.  Contractions: Irritability. Vag. Bleeding: None.  Movement: Present. denies leaking of fluid.     11/01/2021    8:58 AM 07/18/2021    2:01 PM 08/01/2017    8:46 AM  Depression screen PHQ 2/9  Decreased Interest 0 0 0  Down, Depressed, Hopeless 0 0 0  PHQ - 2 Score 0 0 0  Altered sleeping 0 0   Tired, decreased energy 1 1   Change in appetite 0 0   Feeling bad or failure about yourself  0 0   Trouble concentrating 0 0   Moving slowly or fidgety/restless 0 0   Suicidal thoughts 0 0   PHQ-9 Score 1 1         11/01/2021    8:59 AM 07/18/2021    2:01 PM  GAD 7 : Generalized Anxiety Score  Nervous, Anxious, on Edge 1 1  Control/stop worrying 0 1  Worry too much - different things 1 1  Trouble relaxing 0 0  Restless 1 0  Easily annoyed or irritable 1 0  Afraid - awful might happen 0 0  Total GAD 7 Score 4 3      Review of Systems:   Pertinent items are noted in HPI Denies abnormal vaginal discharge w/ itching/odor/irritation, headaches, visual changes, shortness of breath, chest pain, abdominal pain, severe nausea/vomiting, or problems with urination or bowel movements unless otherwise stated above. Pertinent History Reviewed:  Reviewed past medical,surgical, social, obstetrical and family history.  Reviewed problem list, medications and allergies. Physical Assessment:   Vitals:   01/24/22 0849  BP: 131/84  Pulse: (!) 106  Weight: 175 lb (79.4 kg)  Body mass index is 29.12 kg/m.        Physical Examination:   General appearance: Well appearing, and in no  distress  Mental status: Alert, oriented to person, place, and time  Skin: Warm & dry  Cardiovascular: Normal heart rate noted  Respiratory: Normal respiratory effort, no distress  Abdomen: Soft, gravid, nontender  Pelvic: Cervical exam performed  Dilation: Closed Effacement (%): Thick Station: Ballotable  Extremities: Edema: Trace  Fetal Status: Fetal Heart Rate (bpm): 143 Fundal Height: 38 cm Movement: Present Presentation: Vertex, vtx by informal TA u/s  Chaperone: Celene Squibb   No results found for this or any previous visit (from the past 24 hour(s)).  Assessment & Plan:  1) Low-risk pregnancy G1P0000 at [redacted]w[redacted]d with an Estimated Date of Delivery: 01/27/22   2) Anemia, s/p IV venofer  3) High normal bp> asymptomatic, daily home bp's have been 120s/80s, continue checking daily, if >140/90 or pre-e s/s, go to [facility]   Meds: No orders of the defined types were placed in this encounter.  Labs/procedures today: SVE  Plan:  Continue routine obstetrical care  Next visit: prefers will be in person for postdates NST     Reviewed: Term labor symptoms and general obstetric precautions including but not limited to vaginal bleeding, contractions, leaking of fluid and fetal movement were reviewed in detail with the patient.  All questions were answered. Does have home bp cuff. Office bp  cuff given: not applicable. Check bp daily, let us know if consistently >140 and/or >90.  Follow-up: Return in about 6 days (around 01/30/2022) for LROB, NST, CNM.  No future appointments.  No orders of the defined types were placed in this encounter.  Cheral Marker CNM, [facility] 01/24/2022 9:28 AM

## 2022-01-30 ENCOUNTER — Ambulatory Visit: Payer: BC Managed Care – PPO

## 2022-01-30 ENCOUNTER — Telehealth (HOSPITAL_COMMUNITY): Payer: Self-pay | Admitting: *Deleted

## 2022-01-30 ENCOUNTER — Ambulatory Visit (INDEPENDENT_AMBULATORY_CARE_PROVIDER_SITE_OTHER): Payer: BC Managed Care – PPO | Admitting: Women's Health

## 2022-01-30 ENCOUNTER — Encounter: Payer: Self-pay | Admitting: Women's Health

## 2022-01-30 VITALS — BP 122/84 | HR 120 | Wt 177.0 lb

## 2022-01-30 DIAGNOSIS — Z3403 Encounter for supervision of normal first pregnancy, third trimester: Secondary | ICD-10-CM

## 2022-01-30 DIAGNOSIS — O480 Post-term pregnancy: Secondary | ICD-10-CM

## 2022-01-30 DIAGNOSIS — Z3A40 40 weeks gestation of pregnancy: Secondary | ICD-10-CM | POA: Diagnosis not present

## 2022-01-30 NOTE — Progress Notes (Signed)
LOW-RISK PREGNANCY VISIT Patient name: Nicole Mckinney MRN [id number]  Date of birth: 06-20-2000 Chief Complaint:   Routine Prenatal Visit  History of Present Illness:   Nicole Mckinney is a 21 y.o. G38P0000 female at [redacted]w[redacted]d with an Estimated Date of Delivery: 01/27/22 being seen today for ongoing management of a low-risk pregnancy.   Today she reports no complaints. Home bp's wnl. Contractions: Irritability. Vag. Bleeding: None.  Movement: Present. denies leaking of fluid.     11/01/2021    8:58 AM 07/18/2021    2:01 PM 08/01/2017    8:46 AM  Depression screen PHQ 2/9  Decreased Interest 0 0 0  Down, Depressed, Hopeless 0 0 0  PHQ - 2 Score 0 0 0  Altered sleeping 0 0   Tired, decreased energy 1 1   Change in appetite 0 0   Feeling bad or failure about yourself  0 0   Trouble concentrating 0 0   Moving slowly or fidgety/restless 0 0   Suicidal thoughts 0 0   PHQ-9 Score 1 1         11/01/2021    8:59 AM 07/18/2021    2:01 PM  GAD 7 : Generalized Anxiety Score  Nervous, Anxious, on Edge 1 1  Control/stop worrying 0 1  Worry too much - different things 1 1  Trouble relaxing 0 0  Restless 1 0  Easily annoyed or irritable 1 0  Afraid - awful might happen 0 0  Total GAD 7 Score 4 3      Review of Systems:   Pertinent items are noted in HPI Denies abnormal vaginal discharge w/ itching/odor/irritation, headaches, visual changes, shortness of breath, chest pain, abdominal pain, severe nausea/vomiting, or problems with urination or bowel movements unless otherwise stated above. Pertinent History Reviewed:  Reviewed past medical,surgical, social, obstetrical and family history.  Reviewed problem list, medications and allergies. Physical Assessment:   Vitals:   01/30/22 0903  BP: 122/84  Pulse: (!) 120  Weight: 177 lb (80.3 kg)  Body mass index is 29.45 kg/m.        Physical Examination:   General appearance: Well appearing, and in no distress  Mental status: Alert,  oriented to person, place, and time  Skin: Warm & dry  Cardiovascular: Normal heart rate noted  Respiratory: Normal respiratory effort, no distress  Abdomen: Soft, gravid, nontender  Pelvic: Cervical exam performed  Dilation: Closed Effacement (%): Thick Station: Ballotable  Extremities: Edema: None  Fetal Status: Fetal Heart Rate (bpm): 145   Movement: Present Presentation: Vertex NST: FHR baseline 145 bpm, Variability: moderate, Accelerations:present, Decelerations:  Absent= Cat 1/reactive Toco: UI   Chaperone: Peggy Dones   No results found for this or any previous visit (from the past 24 hour(s)).  Assessment & Plan:  1) Low-risk pregnancy G1P0000 at [redacted]w[redacted]d with an Estimated Date of Delivery: 01/27/22   2) Postdates, reactive NST, IOL scheduled for 11/4 @ [place] @ 41.1wk (nothing available 11/3). Cx LTC, not candidate for OP foley.  IOL form faxed and orders placed    Meds: No orders of the defined types were placed in this encounter.  Labs/procedures today: SVE and NST  Plan: IOL 11/4 @[facility]   Reviewed: Term labor symptoms and general obstetric precautions including but not limited to vaginal bleeding, contractions, leaking of fluid and fetal movement were reviewed in detail with the patient.  All questions were answered. Does have home bp cuff. Office bp cuff given: not applicable. Check bp  daily, let us know if consistently >140 and/or >90.  Follow-up: Return for will schedule pp visit after delivery.  Future Appointments  Date Time Provider [place]  02/04/2022 12:00 AM [facility] SCHED ROOM [facility] None    No orders of the defined types were placed in this encounter.  [place], [facility] 01/30/2022 10:29 AM

## 2022-01-30 NOTE — Patient Instructions (Addendum)
Lequita Halt, thank you for choosing our office today! We appreciate the opportunity to meet your healthcare needs. You may receive a short survey by mail, e-mail, or through [employer]. If you are happy with your care we would appreciate if you could take just a few minutes to complete the survey questions. We read all of your comments and take your feedback very seriously. Thank you again for choosing our office.  Center for [employer] Team at [facility]  [facility] at [facility] ([address] [place], [place] [address]) Entrance C, located off of [address] Free 24/7 valet parking   Your induction is scheduled for Sat 11/14 at midnight. You should be there on Friday 11/3 at 11:45pm  Go to the main desk at the [facility] and let them know you are there to be induced. They will send someone from Labor & Delivery to come get you.  You will get a call from a nurse from the hospital within the next day or so to go over some information. If you have any questions, please let us know.    CLASSES: Go to [email or web address] to register for classes (childbirth, breastfeeding, waterbirth, infant CPR, daddy bootcamp, etc.)  Call the office [phone]) or go to [facility] if: You begin to have strong, frequent contractions Your water breaks.  Sometimes it is a big gush of fluid, sometimes it is just a trickle that keeps getting your panties wet or running down your legs You have vaginal bleeding.  It is normal to have a small amount of spotting if your cervix was checked.  You don't feel your baby moving like normal.  If you don't, get you something to eat and drink and lay down and focus on feeling your baby move.   If your baby is still not moving like normal, you should call the office or go to [facility].  Call the office [phone]) or go to [facility] hospital for these signs of pre-eclampsia: Severe headache that does not go away with Tylenol Visual  changes- seeing spots, double, blurred vision Pain under your right breast or upper abdomen that does not go away with Tums or heartburn medicine Nausea and/or vomiting Severe swelling in your hands, feet, and face   [facility] Pediatricians/Family Doctors [facility]): [address] [place], [phone]           [facility]: [address], [phone]                [facility]): [address], [phone] (call to ask if accepting patients) [facility] Department: [address], [place], [phone]    [facility] Pediatricians/Family Doctors [facility]): [address], [phone] [facility]: [address] [place], [phone] [facility]: [address], [phone]  [facility] Doctors  [facility]): [phone] [facility]: [address], [phone]   [facility] Doctors [facility]: [address], [phone]  [facility] Doctors  [facility]: [phone], [phone]  Home Blood Pressure Monitoring for Patients   Your provider has recommended that you check your blood pressure (BP) at least once a week at home. If you do not have a blood pressure cuff at home, one will be provided for you. Contact your provider if you have not received your monitor  within 1 week.   Helpful Tips for Accurate Home Blood Pressure Checks  Don't smoke, exercise, or drink caffeine 30 minutes before checking your BP Use the restroom before checking your BP (a full bladder can raise your pressure) Relax in a comfortable upright chair Feet on the ground Left arm resting comfortably on a flat surface at the level of your heart Legs uncrossed Back supported Sit quietly and don't talk Place the cuff on your bare arm Adjust snuggly, so that  only two fingertips can fit between your skin and the top of the cuff Check 2 readings separated by at least one minute Keep a log of your BP readings For a visual, please reference this diagram: http://ccnc.care/bpdiagram  Provider Name: [facility]     Phone: [phone]  Zone 1: ALL CLEAR  Continue to monitor your symptoms:  BP reading is less than 140 (top number) or less than 90 (bottom number)  No right upper stomach pain No headaches or seeing spots No feeling nauseated or throwing up No swelling in face and hands  Zone 2: CAUTION Call your doctor's office for any of the following:  BP reading is greater than 140 (top number) or greater than 90 (bottom number)  Stomach pain under your ribs in the middle or right side Headaches or seeing spots Feeling nauseated or throwing up Swelling in face and hands  Zone 3: EMERGENCY  Seek immediate medical care if you have any of the following:  BP reading is greater than160 (top number) or greater than 110 (bottom number) Severe headaches not improving with Tylenol Serious difficulty catching your breath Any worsening symptoms from Zone 2   Braxton Hicks Contractions Contractions of the uterus can occur throughout pregnancy, but they are not always a sign that you are in labor. You may have practice contractions called Braxton Hicks contractions. These false labor contractions are sometimes confused with true labor. What are [place] contractions? Braxton Hicks contractions are tightening movements that occur in the muscles of the uterus before labor. Unlike true labor contractions, these contractions do not result in opening (dilation) and thinning of the cervix. Toward the end of pregnancy (32-34 weeks), Braxton Hicks contractions can happen more often and may become stronger. These contractions are sometimes difficult to tell apart from true labor because they can be very uncomfortable. You should not feel embarrassed if  you go to the hospital with false labor. Sometimes, the only way to tell if you are in true labor is for your health care provider to look for changes in the cervix. The health care provider will do a physical exam and may monitor your contractions. If you are not in true labor, the exam should show that your cervix is not dilating and your water has not broken. If there are no other health problems associated with your pregnancy, it is completely safe for you to be sent home with false labor. You may continue to have Braxton Hicks contractions until you go into true labor. How to tell the difference between true labor and false labor True labor Contractions last 30-70 seconds. Contractions become very regular. Discomfort is usually felt in the top of the uterus, and it spreads to the lower abdomen and low back. Contractions do not go away with walking. Contractions usually become more intense and increase in frequency. The cervix dilates and gets thinner. False labor Contractions are usually shorter and not as strong as true labor contractions. Contractions are usually irregular. Contractions are  often felt in the front of the lower abdomen and in the groin. Contractions may go away when you walk around or change positions while lying down. Contractions get weaker and are shorter-lasting as time goes on. The cervix usually does not dilate or become thin. Follow these instructions at home:  Take over-the-counter and prescription medicines only as told by your health care provider. Keep up with your usual exercises and follow other instructions from your health care provider. Eat and drink lightly if you think you are going into labor. If Braxton Hicks contractions are making you uncomfortable: Change your position from lying down or resting to walking, or change from walking to resting. Sit and rest in a tub of warm water. Drink enough fluid to keep your urine pale yellow. Dehydration may  cause these contractions. Do slow and deep breathing several times an hour. Keep all follow-up prenatal visits as told by your health care provider. This is important. Contact a health care provider if: You have a fever. You have continuous pain in your abdomen. Get help right away if: Your contractions become stronger, more regular, and closer together. You have fluid leaking or gushing from your vagina. You pass blood-tinged mucus (bloody show). You have bleeding from your vagina. You have low back pain that you never had before. You feel your baby's head pushing down and causing pelvic pressure. Your baby is not moving inside you as much as it used to. Summary Contractions that occur before labor are called Braxton Hicks contractions, false labor, or practice contractions. Braxton Hicks contractions are usually shorter, weaker, farther apart, and less regular than true labor contractions. True labor contractions usually become progressively stronger and regular, and they become more frequent. Manage discomfort from [facility] contractions by changing position, resting in a warm bath, drinking plenty of water, or practicing deep breathing. This information is not intended to replace advice given to you by your health care provider. Make sure you discuss any questions you have with your health care provider. Document Revised: 03/02/2017 Document Reviewed: 08/03/2016 Elsevier Patient Education  2020 [employer].

## 2022-01-30 NOTE — Telephone Encounter (Signed)
Preadmission screen  

## 2022-01-31 ENCOUNTER — Telehealth (HOSPITAL_COMMUNITY): Payer: Self-pay | Admitting: *Deleted

## 2022-01-31 ENCOUNTER — Encounter (HOSPITAL_COMMUNITY): Payer: Self-pay | Admitting: *Deleted

## 2022-01-31 NOTE — Telephone Encounter (Signed)
Preadmission screen  

## 2022-02-02 ENCOUNTER — Other Ambulatory Visit: Payer: Self-pay

## 2022-02-02 ENCOUNTER — Inpatient Hospital Stay (EMERGENCY_DEPARTMENT_HOSPITAL)
Admission: AD | Admit: 2022-02-02 | Discharge: 2022-02-02 | Disposition: A | Discharge status: Home / Self Care | Payer: BC Managed Care – PPO | Source: Home / Self Care | Attending: Obstetrics and Gynecology | Admitting: Obstetrics and Gynecology

## 2022-02-02 ENCOUNTER — Encounter (HOSPITAL_COMMUNITY): Payer: Self-pay | Admitting: Obstetrics and Gynecology

## 2022-02-02 DIAGNOSIS — O471 False labor at or after 37 completed weeks of gestation: Secondary | ICD-10-CM | POA: Insufficient documentation

## 2022-02-02 DIAGNOSIS — Z3A40 40 weeks gestation of pregnancy: Secondary | ICD-10-CM | POA: Insufficient documentation

## 2022-02-02 DIAGNOSIS — O480 Post-term pregnancy: Secondary | ICD-10-CM | POA: Insufficient documentation

## 2022-02-02 DIAGNOSIS — O479 False labor, unspecified: Secondary | ICD-10-CM | POA: Diagnosis not present

## 2022-02-02 DIAGNOSIS — Z3403 Encounter for supervision of normal first pregnancy, third trimester: Secondary | ICD-10-CM

## 2022-02-02 NOTE — MAU Note (Signed)
Nicole Mckinney is a 21 y.o. at [redacted]w[redacted]d here in MAU reporting: she's having ctxs that are 6-8 minutes apart and has been contracting for over 12 hours.  Denies VB or LOF, reports vaginal discharge.  Endorses +FM, less than usual. LMP: N/A Onset of complaint: yesterday Pain score: 6 Vitals:   02/02/22 1523  BP: 126/83  Pulse: 89  Resp: 19  Temp: 98 F (36.7 C)  SpO2: 99%     FHT: 140 bpm Lab orders placed from triage:   None

## 2022-02-02 NOTE — Progress Notes (Signed)
S: Called by nursing staff for MAU labor eval.  O: BP 131/82   Pulse 94   Temp 98 F (36.7 C) (Oral)   Resp 19   Ht 5\' 5"  (1.651 m)   Wt 80 kg   LMP 04/22/2021   SpO2 99%   BMI 29.35 kg/m  Cervical exam:  Dilation: 1 Effacement (%): 60 Cervical Position: Posterior Station: Ballotable Presentation: Vertex Exam by:: n druebbisch rn   Fetal Monitoring: Baseline: 135 Variability: Moderate Accelerations: Present Decelerations: Absent Contractions: Irritability   A: SIUP at [redacted]w[redacted]d  False labor  P: -Planned induction scheduled for 11/4  Gerlene Fee, DO 02/02/2022 5:45 PM

## 2022-02-03 ENCOUNTER — Other Ambulatory Visit: Payer: Self-pay | Admitting: Advanced Practice Midwife

## 2022-02-04 ENCOUNTER — Inpatient Hospital Stay (HOSPITAL_COMMUNITY)
Admission: AD | Admit: 2022-02-04 | Discharge: 2022-02-06 | DRG: 805 | Disposition: A | Discharge status: Home / Self Care | Payer: BC Managed Care – PPO | Attending: Obstetrics and Gynecology | Admitting: Obstetrics and Gynecology

## 2022-02-04 ENCOUNTER — Inpatient Hospital Stay (HOSPITAL_COMMUNITY): Payer: BC Managed Care – PPO | Admitting: Anesthesiology

## 2022-02-04 ENCOUNTER — Encounter (HOSPITAL_COMMUNITY): Payer: Self-pay | Admitting: Obstetrics and Gynecology

## 2022-02-04 ENCOUNTER — Other Ambulatory Visit: Payer: Self-pay

## 2022-02-04 ENCOUNTER — Inpatient Hospital Stay (HOSPITAL_COMMUNITY): Admission: RE | Admit: 2022-02-04 | Payer: BC Managed Care – PPO | Source: Home / Self Care | Admitting: Family Medicine

## 2022-02-04 ENCOUNTER — Inpatient Hospital Stay (HOSPITAL_COMMUNITY): Payer: BC Managed Care – PPO | Attending: Family Medicine

## 2022-02-04 ENCOUNTER — Encounter: Payer: Self-pay | Admitting: Women's Health

## 2022-02-04 DIAGNOSIS — O9902 Anemia complicating childbirth: Secondary | ICD-10-CM | POA: Diagnosis present

## 2022-02-04 DIAGNOSIS — Z3A41 41 weeks gestation of pregnancy: Secondary | ICD-10-CM | POA: Diagnosis not present

## 2022-02-04 DIAGNOSIS — O99824 Streptococcus B carrier state complicating childbirth: Secondary | ICD-10-CM | POA: Diagnosis present

## 2022-02-04 DIAGNOSIS — O411290 Chorioamnionitis, unspecified trimester, not applicable or unspecified: Secondary | ICD-10-CM | POA: Diagnosis not present

## 2022-02-04 DIAGNOSIS — D509 Iron deficiency anemia, unspecified: Secondary | ICD-10-CM | POA: Diagnosis present

## 2022-02-04 DIAGNOSIS — O480 Post-term pregnancy: Secondary | ICD-10-CM | POA: Diagnosis present

## 2022-02-04 DIAGNOSIS — O411230 Chorioamnionitis, third trimester, not applicable or unspecified: Secondary | ICD-10-CM | POA: Diagnosis not present

## 2022-02-04 DIAGNOSIS — Z3403 Encounter for supervision of normal first pregnancy, third trimester: Secondary | ICD-10-CM

## 2022-02-04 DIAGNOSIS — O99820 Streptococcus B carrier state complicating pregnancy: Secondary | ICD-10-CM | POA: Diagnosis not present

## 2022-02-04 LAB — CBC
HCT: 37.1 % (ref 36.0–46.0)
Hemoglobin: 12.2 g/dL (ref 12.0–15.0)
MCH: 29.6 pg (ref 26.0–34.0)
MCHC: 32.9 g/dL (ref 30.0–36.0)
MCV: 90 fL (ref 80.0–100.0)
Platelets: 218 10*3/uL (ref 150–400)
RBC: 4.12 MIL/uL (ref 3.87–5.11)
RDW: 21.7 % — ABNORMAL HIGH (ref 11.5–15.5)
WBC: 9.9 10*3/uL (ref 4.0–10.5)
nRBC: 0 % (ref 0.0–0.2)

## 2022-02-04 LAB — TYPE AND SCREEN
ABO/RH(D): A POS
Antibody Screen: NEGATIVE

## 2022-02-04 LAB — RPR: RPR Ser Ql: NONREACTIVE

## 2022-02-04 MED ORDER — ZOLPIDEM TARTRATE 5 MG PO TABS: 5.0000 mg | ORAL_TABLET | Freq: Every evening | ORAL | Status: DC | PRN

## 2022-02-04 MED ORDER — LIDOCAINE-EPINEPHRINE (PF) 2 %-1:200000 IJ SOLN
INTRAMUSCULAR | Status: DC | PRN
  Administered 2022-02-04: 5 mL via EPIDURAL

## 2022-02-04 MED ORDER — SENNOSIDES-DOCUSATE SODIUM 8.6-50 MG PO TABS
2.0000 | ORAL_TABLET | ORAL | Status: DC
  Administered 2022-02-05: 2 via ORAL
  Filled 2022-02-04: qty 2

## 2022-02-04 MED ORDER — ONDANSETRON HCL 4 MG/2ML IJ SOLN: 4.0000 mg | INTRAMUSCULAR | Status: DC | PRN

## 2022-02-04 MED ORDER — SODIUM CHLORIDE 0.9 % IV SOLN: INTRAVENOUS | Status: DC | PRN

## 2022-02-04 MED ORDER — ACETAMINOPHEN 325 MG PO TABS
325.0000 mg | ORAL_TABLET | Freq: Once | ORAL | Status: AC
  Administered 2022-02-04: 325 mg via ORAL
  Filled 2022-02-04: qty 1

## 2022-02-04 MED ORDER — DIPHENHYDRAMINE HCL 25 MG PO CAPS: 25.0000 mg | ORAL_CAPSULE | Freq: Four times a day (QID) | ORAL | Status: DC | PRN

## 2022-02-04 MED ORDER — OXYCODONE HCL 5 MG PO TABS: 5.0000 mg | ORAL_TABLET | ORAL | Status: DC | PRN

## 2022-02-04 MED ORDER — ALBUTEROL SULFATE (2.5 MG/3ML) 0.083% IN NEBU: 2.5000 mg | INHALATION_SOLUTION | Freq: Once | RESPIRATORY_TRACT | Status: DC | PRN

## 2022-02-04 MED ORDER — METHYLPREDNISOLONE SODIUM SUCC 125 MG IJ SOLR: 125.0000 mg | Freq: Once | INTRAMUSCULAR | Status: DC | PRN

## 2022-02-04 MED ORDER — TETANUS-DIPHTH-ACELL PERTUSSIS 5-2.5-18.5 LF-MCG/0.5 IM SUSY: 0.5000 mL | PREFILLED_SYRINGE | Freq: Once | INTRAMUSCULAR | Status: DC

## 2022-02-04 MED ORDER — SIMETHICONE 80 MG PO CHEW: 80.0000 mg | CHEWABLE_TABLET | ORAL | Status: DC | PRN

## 2022-02-04 MED ORDER — GENTAMICIN SULFATE 40 MG/ML IJ SOLN
5.0000 mg/kg | INTRAVENOUS | Status: DC
  Administered 2022-02-04: 330 mg via INTRAVENOUS
  Filled 2022-02-04: qty 8.25

## 2022-02-04 MED ORDER — DIBUCAINE (PERIANAL) 1 % EX OINT: 1.0000 | TOPICAL_OINTMENT | CUTANEOUS | Status: DC | PRN

## 2022-02-04 MED ORDER — LIDOCAINE HCL (PF) 1 % IJ SOLN: 30.0000 mL | INTRAMUSCULAR | Status: DC | PRN

## 2022-02-04 MED ORDER — PRENATAL MULTIVITAMIN CH
1.0000 | ORAL_TABLET | Freq: Every day | ORAL | Status: DC
  Administered 2022-02-05 – 2022-02-06 (×2): 1 via ORAL
  Filled 2022-02-04 (×2): qty 1

## 2022-02-04 MED ORDER — LACTATED RINGERS IV SOLN
500.0000 mL | Freq: Once | INTRAVENOUS | Status: AC
  Administered 2022-02-04: 500 mL via INTRAVENOUS

## 2022-02-04 MED ORDER — EPHEDRINE 5 MG/ML INJ: 10.0000 mg | INTRAVENOUS | Status: DC | PRN

## 2022-02-04 MED ORDER — COCONUT OIL OIL: 1.0000 | TOPICAL_OIL | Status: DC | PRN

## 2022-02-04 MED ORDER — DIPHENHYDRAMINE HCL 50 MG/ML IJ SOLN: 25.0000 mg | Freq: Once | INTRAMUSCULAR | Status: DC | PRN

## 2022-02-04 MED ORDER — MISOPROSTOL 25 MCG QUARTER TABLET: 25.0000 ug | ORAL_TABLET | Freq: Once | ORAL | Status: DC

## 2022-02-04 MED ORDER — OXYCODONE-ACETAMINOPHEN 5-325 MG PO TABS: 2.0000 | ORAL_TABLET | ORAL | Status: DC | PRN

## 2022-02-04 MED ORDER — PENICILLIN G POT IN DEXTROSE 60000 UNIT/ML IV SOLN
3.0000 10*6.[IU] | INTRAVENOUS | Status: DC
  Administered 2022-02-04 (×2): 3 10*6.[IU] via INTRAVENOUS
  Filled 2022-02-04 (×2): qty 50

## 2022-02-04 MED ORDER — LACTATED RINGERS IV SOLN: 500.0000 mL | INTRAVENOUS | Status: DC | PRN

## 2022-02-04 MED ORDER — EPINEPHRINE PF 1 MG/ML IJ SOLN: 0.3000 mg | Freq: Once | INTRAMUSCULAR | Status: DC | PRN

## 2022-02-04 MED ORDER — DOCUSATE SODIUM 100 MG PO CAPS
100.0000 mg | ORAL_CAPSULE | Freq: Two times a day (BID) | ORAL | Status: DC
  Administered 2022-02-05 – 2022-02-06 (×3): 100 mg via ORAL
  Filled 2022-02-04 (×3): qty 1

## 2022-02-04 MED ORDER — SODIUM CHLORIDE 0.9 % IV BOLUS: 500.0000 mL | Freq: Once | INTRAVENOUS | Status: DC | PRN

## 2022-02-04 MED ORDER — ONDANSETRON HCL 4 MG PO TABS: 4.0000 mg | ORAL_TABLET | ORAL | Status: DC | PRN

## 2022-02-04 MED ORDER — LACTATED RINGERS IV SOLN: INTRAVENOUS | Status: DC

## 2022-02-04 MED ORDER — OXYTOCIN-SODIUM CHLORIDE 30-0.9 UT/500ML-% IV SOLN
2.5000 [IU]/h | INTRAVENOUS | Status: DC
  Filled 2022-02-04: qty 500

## 2022-02-04 MED ORDER — PHENYLEPHRINE 80 MCG/ML (10ML) SYRINGE FOR IV PUSH (FOR BLOOD PRESSURE SUPPORT): 80.0000 ug | PREFILLED_SYRINGE | INTRAVENOUS | Status: DC | PRN

## 2022-02-04 MED ORDER — FENTANYL-BUPIVACAINE-NACL 0.5-0.125-0.9 MG/250ML-% EP SOLN
12.0000 mL/h | EPIDURAL | Status: DC | PRN
  Administered 2022-02-04: 12 mL/h via EPIDURAL
  Filled 2022-02-04: qty 250

## 2022-02-04 MED ORDER — SOD CITRATE-CITRIC ACID 500-334 MG/5ML PO SOLN: 30.0000 mL | ORAL | Status: DC | PRN

## 2022-02-04 MED ORDER — WITCH HAZEL-GLYCERIN EX PADS: 1.0000 | MEDICATED_PAD | CUTANEOUS | Status: DC | PRN

## 2022-02-04 MED ORDER — OXYTOCIN BOLUS FROM INFUSION
333.0000 mL | Freq: Once | INTRAVENOUS | Status: AC
  Administered 2022-02-04: 333 mL via INTRAVENOUS

## 2022-02-04 MED ORDER — SODIUM CHLORIDE 0.9 % IV SOLN
5.0000 10*6.[IU] | Freq: Once | INTRAVENOUS | Status: AC
  Administered 2022-02-04: 5 10*6.[IU] via INTRAVENOUS
  Filled 2022-02-04: qty 5

## 2022-02-04 MED ORDER — BENZOCAINE-MENTHOL 20-0.5 % EX AERO
1.0000 | INHALATION_SPRAY | CUTANEOUS | Status: DC | PRN
  Administered 2022-02-04: 1 via TOPICAL
  Filled 2022-02-04: qty 56

## 2022-02-04 MED ORDER — TRANEXAMIC ACID-NACL 1000-0.7 MG/100ML-% IV SOLN
INTRAVENOUS | Status: AC
  Administered 2022-02-04: 1000 mg
  Filled 2022-02-04: qty 100

## 2022-02-04 MED ORDER — FLEET ENEMA 7-19 GM/118ML RE ENEM: 1.0000 | ENEMA | RECTAL | Status: DC | PRN

## 2022-02-04 MED ORDER — ACETAMINOPHEN 325 MG PO TABS
650.0000 mg | ORAL_TABLET | ORAL | Status: DC | PRN
  Administered 2022-02-04 – 2022-02-06 (×2): 650 mg via ORAL
  Filled 2022-02-04 (×2): qty 2

## 2022-02-04 MED ORDER — OXYCODONE-ACETAMINOPHEN 5-325 MG PO TABS: 1.0000 | ORAL_TABLET | ORAL | Status: DC | PRN

## 2022-02-04 MED ORDER — TERBUTALINE SULFATE 1 MG/ML IJ SOLN: 0.2500 mg | Freq: Once | INTRAMUSCULAR | Status: DC | PRN

## 2022-02-04 MED ORDER — HYDROXYZINE HCL 50 MG PO TABS: 50.0000 mg | ORAL_TABLET | Freq: Four times a day (QID) | ORAL | Status: DC | PRN

## 2022-02-04 MED ORDER — OXYCODONE HCL 5 MG PO TABS: 10.0000 mg | ORAL_TABLET | ORAL | Status: DC | PRN

## 2022-02-04 MED ORDER — SODIUM CHLORIDE 0.9 % IV SOLN
2.0000 g | Freq: Four times a day (QID) | INTRAVENOUS | Status: DC
  Administered 2022-02-04: 2 g via INTRAVENOUS
  Filled 2022-02-04: qty 2000

## 2022-02-04 MED ORDER — IBUPROFEN 600 MG PO TABS
600.0000 mg | ORAL_TABLET | Freq: Four times a day (QID) | ORAL | Status: DC
  Administered 2022-02-04 – 2022-02-06 (×8): 600 mg via ORAL
  Filled 2022-02-04 (×8): qty 1

## 2022-02-04 MED ORDER — FENTANYL CITRATE (PF) 100 MCG/2ML IJ SOLN
100.0000 ug | INTRAMUSCULAR | Status: DC | PRN
  Administered 2022-02-04 (×2): 100 ug via INTRAVENOUS
  Filled 2022-02-04 (×2): qty 2

## 2022-02-04 MED ORDER — GENTAMICIN SULFATE 40 MG/ML IJ SOLN: 5.0000 mg/kg | INTRAVENOUS | Status: DC

## 2022-02-04 MED ORDER — ONDANSETRON HCL 4 MG/2ML IJ SOLN
4.0000 mg | Freq: Four times a day (QID) | INTRAMUSCULAR | Status: DC | PRN
  Administered 2022-02-04 (×2): 4 mg via INTRAVENOUS
  Filled 2022-02-04 (×2): qty 2

## 2022-02-04 MED ORDER — DIPHENHYDRAMINE HCL 50 MG/ML IJ SOLN: 12.5000 mg | INTRAMUSCULAR | Status: DC | PRN

## 2022-02-04 MED ORDER — SODIUM CHLORIDE 0.9 % IV SOLN
500.0000 mg | Freq: Once | INTRAVENOUS | Status: AC
  Administered 2022-02-04: 500 mg via INTRAVENOUS
  Filled 2022-02-04: qty 25

## 2022-02-04 MED ORDER — TRANEXAMIC ACID-NACL 1000-0.7 MG/100ML-% IV SOLN: 1000.0000 mg | INTRAVENOUS | Status: DC

## 2022-02-04 MED ORDER — ACETAMINOPHEN 325 MG PO TABS
650.0000 mg | ORAL_TABLET | ORAL | Status: DC | PRN
  Administered 2022-02-04: 650 mg via ORAL
  Filled 2022-02-04: qty 2

## 2022-02-04 MED ORDER — MISOPROSTOL 50MCG HALF TABLET: 50.0000 ug | ORAL_TABLET | Freq: Once | ORAL | Status: DC

## 2022-02-04 NOTE — Plan of Care (Signed)

## 2022-02-04 NOTE — Progress Notes (Signed)
Labor Progress Note  Nicole Mckinney is a 21 y.o. G1P0000 at [redacted]w[redacted]d presented for IOL PD and SOL  S: Pt now has epidural and feels better  O:  BP 112/62   Pulse 94   Temp 98 F (36.7 C) (Oral)   Resp (!) 26   Ht 5\' 5"  (1.651 m)   Wt 80.1 kg   LMP 04/22/2021   SpO2 100%   BMI 29.37 kg/m  EFM: 155 bpm/Moderate variability/ 15x15 accels/ None decels  CVE: Dilation: 8 Effacement (%): 90 Cervical Position: Middle Station: -1 Presentation: Vertex Exam by:: [name]   A&P: 26 y.o. G1P0000 [redacted]w[redacted]d  here for IOL as above as above  #Labor: Progressing well. AROM light meconium #Pain: Epidural #FWB: CAT 1 #GBS positive- PCN  [name], DO [place] Fellow, Faculty practice [name] for [facility] 02/04/22  7:51 AM

## 2022-02-04 NOTE — H&P (Addendum)
OBSTETRIC ADMISSION HISTORY AND PHYSICAL  Nicole Mckinney is a 21 y.o. female G1P0000 with IUP at [redacted]w[redacted]d by LMP presenting for spontaneous onset of labor, although was initially scheduled for induction due to post dates at midnight. Presented to MAU initially for contractions q5 min. She reports +FMs, No LOF, no VB. She plans on breast feeding. She requests POPs for birth control. She received her prenatal [facility] at [facility]   Dating: By LMP c/w 8 wk [place] --->  Estimated Date of Delivery: 01/27/22  Sono:    [place] 19+3 wks,cephalic,posterior placenta gr 0,CX 4.3 cm,FHR 157 bpm,LVEICF 1.7 mm,normal ovaries,SVP of fluid 3.1 cm,EFW 277 g 30%,anatomy complete,no obvious abnormalities   Prenatal History/Complications:  Anemia in pregnancy  Past Medical History: Past Medical History:  Diagnosis Date   Anxiety    Headache     Past Surgical History: History reviewed. No pertinent surgical history.  Obstetrical History: OB History     Gravida  1   Para  0   Term  0   Preterm  0   AB  0   Living  0      SAB  0   IAB  0   Ectopic  0   Multiple  0   Live Births  0           Social History Social History   Socioeconomic History   Marital status: Married    Spouse name: Not on file   Number of children: Not on file   Years of education: Not on file   Highest education level: Not on file  Occupational History   Not on file  Tobacco Use   Smoking status: Never   Smokeless tobacco: Never  Vaping Use   Vaping Use: Never used  Substance and Sexual Activity   Alcohol use: No   Drug use: No   Sexual activity: Yes    Birth control/protection: None  Other Topics Concern   Not on file  Social History Narrative   Not on file   Social Determinants of Health   Financial Resource Strain: Low Risk  (07/18/2021)   Overall Financial Resource Strain (CARDIA)    Difficulty of Paying Living Expenses: Not hard at all  Food Insecurity: No Food Insecurity (02/04/2022)    Hunger Vital Sign    Worried About Running Out of Food in the Last Year: Never true    Ran Out of Food in the Last Year: Never true  Transportation Needs: No Transportation Needs (02/04/2022)   PRAPARE - Administrator, Civil Service (Medical): No    Lack of Transportation (Non-Medical): No  Physical Activity: Sufficiently Active (07/18/2021)   Exercise Vital Sign    Days of Exercise per Week: 5 days    Minutes of Exercise per Session: 60 min  Stress: Stress Concern Present (07/18/2021)   [employer] of Occupational Health - Occupational Stress Questionnaire    Feeling of Stress : To some extent  Social Connections: Moderately Integrated (07/18/2021)   Social Connection and Isolation Panel [NHANES]    Frequency of Communication with Friends and Family: More than three times a week    Frequency of Social Gatherings with Friends and Family: Twice a week    Attends Religious Services: More than 4 times per year    Active Member of [employer] or Organizations: No    Attends Banker Meetings: Never    Marital Status: Married    Family History: Family History  Problem Relation Age of Onset   Hyperlipidemia Father     Allergies: No Known Allergies  Medications Prior to Admission  Medication Sig Dispense Refill Last Dose   ferrous sulfate 325 (65 FE) MG tablet Take 1 tablet (325 mg total) by mouth every other day. 45 tablet 2 02/03/2022   Prenatal Vit-Fe Fumarate-FA (PRENATAL VITAMIN PO) Take by mouth.   02/03/2022     Review of Systems   All systems reviewed and negative except as stated in HPI  Blood pressure 122/89, pulse (!) 106, temperature 98 F (36.7 C), temperature source Oral, resp. rate (!) 26, height 5\' 5"  (1.651 m), weight 80.1 kg, last menstrual period 04/22/2021, SpO2 99 %. General appearance: alert and cooperative Lungs: normal work of breathing Heart: good peripheral perfusion Abdomen: soft, non-tender; bowel sounds normal Pelvic:  5/60/-3 Presentation: cephalic Fetal monitoringBaseline: 145 bpm, Variability: Good {> 6 bpm), Accelerations: Reactive, and no decels Uterine activityFrequency: Every 4-9 minutes Dilation: 5 Effacement (%): 60 Station: -3 Exam by:: [place] MD   Prenatal labs: ABO, Rh: --/--/A POS (11/04 0259) A+ Antibody: NEG (11/04 0259) Rubella: 1.23 (04/17 1509) RPR: Non Reactive (08/01 0831)  HBsAg: Negative (04/17 1509)  HIV: Non Reactive (08/01 0831)  GBS: Positive/-- (10/03 1400)  1 hr Glucola Passed Genetic screening  Negative Anatomy 05-28-1989 was nml  Prenatal Transfer Tool  Maternal Diabetes: No Genetic Screening: Normal Maternal Ultrasounds/Referrals: Normal Fetal Ultrasounds or other Referrals:  None Maternal Substance Abuse:  No Significant Maternal Medications:  None Significant Maternal Lab Results:  Group B Strep positive Number of Prenatal Visits:greater than 3 verified prenatal visits Other Comments:   Declined Tdap in pregnancy  Results for orders placed or performed during the hospital encounter of 02/04/22 (from the past 24 hour(s))  CBC   Collection Time: 02/04/22  2:59 AM  Result Value Ref Range   WBC 9.9 4.0 - 10.5 K/uL   RBC 4.12 3.87 - 5.11 MIL/uL   Hemoglobin 12.2 12.0 - 15.0 g/dL   HCT 13/04/23 17.4 - 08.1 %   MCV 90.0 80.0 - 100.0 fL   MCH 29.6 26.0 - 34.0 pg   MCHC 32.9 30.0 - 36.0 g/dL   RDW 44.8 (H) 18.5 - 63.1 %   Platelets 218 150 - 400 K/uL   nRBC 0.0 0.0 - 0.2 %  Type and screen   Collection Time: 02/04/22  2:59 AM  Result Value Ref Range   ABO/RH(D) A POS    Antibody Screen NEG    Sample Expiration      02/07/2022,2359 Performed at [facility], [address], [place], [place]     Patient Active Problem List   Diagnosis Date Noted   Post-dates pregnancy 02/04/2022   Anemia affecting pregnancy 11/23/2021   Supervision of normal first pregnancy 07/18/2021   Anemia, iron deficiency 02/06/2014   Exercise-induced asthma 07/22/2013    Heart murmur 04/14/2013   Allergic rhinitis 12/17/2012    Assessment/Plan:  Nicole Mckinney is a 21 y.o. G1P0000 at [redacted]w[redacted]d here for SOL  #Labor: 5 cm/60/-3. Progressing well without intervention. Expectant management. AROM after epidural. #Pain: Desires epidural #FWB: Cat 1 #ID:  Positive - intrapartum PCN #MOF: Breast #MOC: POPs #Circ:  Yes  [redacted]w[redacted]d, MD  02/04/2022, 5:52 AM  ____  GME ATTESTATION:  Evaluation and management procedures were performed by the [facility] Resident under my supervision. I was immediately available for direct supervision, assistance and direction throughout this encounter.  I also confirm  that I have verified the information documented in the resident's note, and that I have also personally reperformed the pertinent components of the physical exam and all of the medical decision making activities.  I have also made any necessary editorial changes.  [name], DO OB Fellow, [place] for [facility] 02/04/2022 6:23 AM

## 2022-02-04 NOTE — Anesthesia Procedure Notes (Signed)
Epidural Patient location during procedure: OB Start time: 02/04/2022 6:05 AM End time: 02/04/2022 6:15 AM  Staffing Anesthesiologist: Freddrick March, MD Performed: anesthesiologist   Preanesthetic Checklist Completed: patient identified, IV checked, risks and benefits discussed, monitors and equipment checked, pre-op evaluation and timeout performed  Epidural Patient position: sitting Prep: DuraPrep and site prepped and draped Patient monitoring: continuous pulse ox, blood pressure, heart rate and cardiac monitor Approach: midline Location: L3-L4 Injection technique: LOR air  Needle:  Needle type: Tuohy  Needle gauge: 17 G Needle length: 9 cm Needle insertion depth: 5 cm Catheter type: closed end flexible Catheter size: 19 Gauge Catheter at skin depth: 10 cm Test dose: negative  Assessment Sensory level: T8 Events: blood not aspirated, injection not painful, no injection resistance, no paresthesia and negative IV test  Additional Notes Patient identified. Risks/Benefits/Options discussed with patient including but not limited to bleeding, infection, nerve damage, paralysis, failed block, incomplete pain control, headache, blood pressure changes, nausea, vomiting, reactions to medication both or allergic, itching and postpartum back pain. Confirmed with bedside nurse the patient's most recent platelet count. Confirmed with patient that they are not currently taking any anticoagulation, have any bleeding history or any family history of bleeding disorders. Patient expressed understanding and wished to proceed. All questions were answered. Sterile technique was used throughout the entire procedure. Please see nursing notes for vital signs. Test dose was given through epidural catheter and negative prior to continuing to dose epidural or start infusion. Warning signs of high block given to the patient including shortness of breath, tingling/numbness in hands, complete motor block,  or any concerning symptoms with instructions to call for help. Patient was given instructions on fall risk and not to get out of bed. All questions and concerns addressed with instructions to call with any issues or inadequate analgesia.  Reason for block:procedure for pain

## 2022-02-04 NOTE — Progress Notes (Signed)
Nicole Mckinney is a 21 y.o. G1P0000 at [redacted]w[redacted]d admitted for active labor  Subjective:  Resting comfortably with epidural. No questions or concerns at this time.   Objective: BP (!) 97/52   Pulse 84   Temp 98.2 F (36.8 C) (Oral)   Resp 16   Ht 5\' 5"  (1.651 m)   Wt 80.1 kg   LMP 04/22/2021   SpO2 100%   BMI 29.37 kg/m  No intake/output data recorded. No intake/output data recorded.  FHT:  FHR: 140 bpm, variability: moderate,  accelerations:  Present,  decelerations:  Present early UC:   regular, every 2-5 minutes SVE:   Dilation: 8 Effacement (%): 90 Station: -1 Exam by:: k fields, rn  Labs: Lab Results  Component Value Date   WBC 9.9 02/04/2022   HGB 12.2 02/04/2022   HCT 37.1 02/04/2022   MCV 90.0 02/04/2022   PLT 218 02/04/2022    Assessment / Plan: Spontaneous labor, progressing normally  Labor: Progressing normally Preeclampsia:   NA Fetal Wellbeing:  Category I Pain Control:  Epidural I/D:  n/a Anticipated MOD:  NSVD  Marcille Buffy DNP, CNM  02/04/22  9:26 AM

## 2022-02-04 NOTE — Progress Notes (Signed)
ANTIBIOTIC CONSULT NOTE - INITIAL  Pharmacy Consult for Gentamicin Indication: Chorioamnionitis   No Known Allergies  Patient Measurements: Height: 5\' 5"  (165.1 cm) Weight: 80.1 kg (176 lb 8 oz) IBW/kg (Calculated) : 57 Adjusted Body Weight: 66.2 kg  Vital Signs: Temp: 102.1 F (38.9 C) (11/04 1407) Temp Source: Oral (11/04 1407) BP: 109/69 (11/04 1359) Pulse Rate: 133 (11/04 1359)  Labs: Recent Labs    02/04/22 0259  WBC 9.9  HGB 12.2  PLT 218   No results for input(s): "GENTTROUGH", "GENTPEAK", "GENTRANDOM" in the last 72 hours.   Microbiology: No results found for this or any previous visit (from the past 720 hour(s)).  Medications:  Ampicillin 2 gm IV q 6 hr (11/4>>  Goal of Therapy:  Gentamicin peak 20-25 mg/L and Trough < 1 mg/L  Plan:  Gentamicin 330 mg IV every 24 hrs  Check Scr with next labs if gentamicin continued. Will check gentamicin levels if continued > 72hr or clinically indicated.  Wyline Mood 02/04/2022,2:36 PM

## 2022-02-04 NOTE — Progress Notes (Signed)
Nicole Mckinney is a 21 y.o. G1P0000 at [redacted]w[redacted]d admitted for active labor  Subjective: Pushing now  Objective: BP 109/69   Pulse (!) 133   Temp (!) 102.1 F (38.9 C) (Oral)   Resp 18   Ht 5\' 5"  (1.651 m)   Wt 80.1 kg   LMP 04/22/2021   SpO2 100%   BMI 29.37 kg/m  No intake/output data recorded. No intake/output data recorded.  FHT:  FHR: 210 bpm, variability: moderate,  accelerations:  Present,  decelerations:  Present variables with pushing  UC:   regular, every 1-3 minutes SVE:   Dilation: 10 Effacement (%): 100 Station: Plus 1 Exam by:: k fields, rn  Labs: Lab Results  Component Value Date   WBC 9.9 02/04/2022   HGB 12.2 02/04/2022   HCT 37.1 02/04/2022   MCV 90.0 02/04/2022   PLT 218 02/04/2022    Assessment / Plan: Spontaneous labor, progressing normally  Labor: Progressing normally Preeclampsia:   NA Fetal Wellbeing:  Category II Pain Control:  Epidural I/D:   Temp 102 and fetal tachycardia. Triple I, amp, gent ordered. Fluid bolus and tylenol. Dr. Ilda Basset notified of Maternal/Fetal status.  Anticipated MOD:  NSVD   Marcille Buffy DNP, CNM  02/04/22  2:26 PM

## 2022-02-04 NOTE — MAU Note (Signed)
..  Nicole Mckinney is a 21 y.o. at [redacted]w[redacted]d here in MAU reporting: contractions since yesterday that are now every 4-6 minutes. Denies vaginal bleeding or leaking of fluid. +FM  Pain score: 7/10 DQQ:[id number] in room

## 2022-02-04 NOTE — Anesthesia Preprocedure Evaluation (Signed)
Anesthesia Evaluation  Patient identified by MRN, date of birth, ID band Patient awake    Reviewed: Allergy & Precautions, NPO status , Patient's Chart, lab work & pertinent test results  Airway Mallampati: II  TM Distance: >3 FB Neck ROM: Full    Dental no notable dental hx.    Pulmonary asthma    Pulmonary exam normal breath sounds clear to auscultation       Cardiovascular negative cardio ROS Normal cardiovascular exam Rhythm:Regular Rate:Normal     Neuro/Psych  Headaches PSYCHIATRIC DISORDERS Anxiety        GI/Hepatic negative GI ROS, Neg liver ROS,,,  Endo/Other  negative endocrine ROS    Renal/GU negative Renal ROS  negative genitourinary   Musculoskeletal negative musculoskeletal ROS (+)    Abdominal   Peds  Hematology negative hematology ROS (+)   Anesthesia Other Findings Presents in labor  Reproductive/Obstetrics (+) Pregnancy                             Anesthesia Physical Anesthesia Plan  ASA: 2  Anesthesia Plan: Epidural   Post-op Pain Management:    Induction:   PONV Risk Score and Plan: Treatment may vary due to age or medical condition  Airway Management Planned: Natural Airway  Additional Equipment:   Intra-op Plan:   Post-operative Plan:   Informed Consent: I have reviewed the patients History and Physical, chart, labs and discussed the procedure including the risks, benefits and alternatives for the proposed anesthesia with the patient or authorized representative who has indicated his/her understanding and acceptance.       Plan Discussed with: Anesthesiologist  Anesthesia Plan Comments: (Patient identified. Risks, benefits, options discussed with patient including but not limited to bleeding, infection, nerve damage, paralysis, failed block, incomplete pain control, headache, blood pressure changes, nausea, vomiting, reactions to medication, itching,  and post partum back pain. Confirmed with bedside nurse the patient's most recent platelet count. Confirmed with the patient that they are not taking any anticoagulation, have any bleeding history or any family history of bleeding disorders. Patient expressed understanding and wishes to proceed. All questions were answered. )       Anesthesia Quick Evaluation

## 2022-02-04 NOTE — MAU Provider Note (Addendum)
S: [name] is a 21 y.o. G1P0000 at [redacted]w[redacted]d  who presents to MAU today complaining contractions q 5 minutes since yesterday. She denies vaginal bleeding. She denies LOF. She reports normal fetal movement.    O: BP 127/77   Pulse 93   Temp 97.9 F (36.6 C) (Oral)   Resp (!) 26   Ht 5\' 5"  (1.651 m)   Wt 80.1 kg   LMP 04/22/2021   SpO2 99%   BMI 29.37 kg/m  GENERAL: Well-developed, well-nourished female in no acute distress.  HEAD: Normocephalic, atraumatic.  CHEST: Normal effort of breathing, regular heart rate ABDOMEN: Soft, nontender, gravid  Cervical exam:  Dilation: 1.5 Effacement (%): 70 Cervical Position: Posterior Station: Ballotable Exam by:: Donzetta Sprung, RN   Fetal Monitoring: Baseline: 135 Variability: moderate Accelerations: present Decelerations: absent Contractions: q 1-5 minutes   A: SIUP at [redacted]w[redacted]d  Active labor Changed to 3 cm in 2 hrs  P: Admit to L&D   Wilhemina Cash, MD 02/04/2022 12:45 AM  ___  GME ATTESTATION:  Evaluation and management procedures were performed by the [facility] Medicine Resident under my supervision. I was immediately available for direct supervision, assistance and direction throughout this encounter.  I also confirm that I have verified the information documented in the resident's note, and that I have also personally reperformed the pertinent components of the physical exam and all of the medical decision making activities.  I have also made any necessary editorial changes.  [name], DO OB Fellow, [place] for [place] 02/04/2022 4:50 AM

## 2022-02-04 NOTE — Lactation Note (Signed)
This note was copied from a baby's chart. Lactation Consultation Note  Patient Name: Boy Haevyn Ury [id number] Date: 02/04/2022   Age:23 hours  Lactation orders received and acknowledged. Spoke to [employer] and let her know that Ms. Oestreicher will need to be set up with a DEBP by the night shift RN within 6 hours of birth. NICU LC to follow up tomorrow morning for initial assessment.   Haruto Demaria S Nelline Lio 02/04/2022, 6:51 PM

## 2022-02-04 NOTE — Discharge Summary (Shared)
Postpartum Discharge Summary  Date of Service updated***     Patient Name: Nicole Mckinney DOB: 2000/11/15 MRN: [id number]  Date of admission: 02/04/2022 Delivery date:02/04/2022  Delivering provider: Aletha Halim  Date of discharge: 02/04/2022  Admitting diagnosis: Post-dates pregnancy [O48.0] Intrauterine pregnancy: [redacted]w[redacted]d    Secondary diagnosis:  Principal Problem:   Post-dates pregnancy  Additional problems: ***    Discharge diagnosis: Term Pregnancy Delivered                                              Post partum procedures:{Postpartum procedures:23558} Augmentation: AROM Complications: Intrauterine Inflammation or infection (Chorioamniotis)  Hospital course: Onset of Labor With Vaginal Delivery      21y.o. yo G1P0000 at 487w1das admitted in Active Labor on 02/04/2022. Labor course was complicated by Triple I   Membrane Rupture Time/Date: 6:53 AM ,02/04/2022   Delivery Method:Vaginal, Vacuum (Extractor)  Episiotomy: Median  Lacerations:    Patient had a postpartum course complicated by ***.  She is ambulating, tolerating a regular diet, passing flatus, and urinating well. Patient is discharged home in stable condition on 02/04/22.  Newborn Data: Birth date:02/04/2022  Birth time:3:13 PM  Gender:  Living status:  Apgars: ,  Weight:   Magnesium Sulfate received: No BMZ received: No Rhophylac:N/A MMR:{MMR:30440033} T-DaP:{Tdap:23962} Flu: {F{EVO:35009}ransfusion:{Transfusion received:30440034}  Physical exam  Vitals:   02/04/22 1359 02/04/22 1407 02/04/22 1432 02/04/22 1500  BP: 109/69  109/61 108/65  Pulse: (!) 133  (!) 120 (!) 112  Resp: _0 Temp: 99.3 F (37.4 C) (!) 102.1 F (38.9 C)    TempSrc: Oral Oral    SpO2:      Weight:      Height:       General: {Exam; general:21111117} Lochia: {Desc; appropriate/inappropriate:30686::"appropriate"} Uterine Fundus: {Desc; firm/soft:30687} Incision: {Exam; incision:21111123} DVT  Evaluation: {Exam; dvt:2111122} Labs: Lab Results  Component Value Date   WBC 9.9 02/04/2022   HGB 12.2 02/04/2022   HCT 37.1 02/04/2022   MCV 90.0 02/04/2022   PLT 218 02/04/2022      Latest Ref Rng & Units 08/20/2018    9:37 AM  CMP  Glucose 65 - 99 mg/dL 76   BUN 6 - 20 mg/dL 7   Creatinine 0.57 - 1.00 mg/dL 0.74   Sodium 134 - 144 mmol/L 138   Potassium 3.5 - 5.2 mmol/L 4.2   Chloride 96 - 106 mmol/L 102   CO2 20 - 29 mmol/L 21   Calcium 8.7 - 10.2 mg/dL 9.4   Total Protein 6.0 - 8.5 g/dL 6.7   Total Bilirubin 0.0 - 1.2 mg/dL <0.2   Alkaline Phos 39 - 117 IU/L 76   AST 0 - 40 IU/L 19   ALT 0 - 32 IU/L 14    Edinburgh Score:     No data to display            After visit meds:  Allergies as of 02/04/2022   No Known Allergies   Med Rec must be completed prior to using [facility]        Discharge home in stable condition Infant Feeding: Breast Infant Disposition:{CHL IP OB HOME WITH MOFGHWEX:93716}ischarge instruction: per After Visit Summary and Postpartum booklet. Activity: Advance as tolerated. Pelvic rest for 6 weeks.  Diet: routine diet Anticipated Birth Control: POPs Postpartum  Appointment:6 weeks Additional Postpartum F/U: {PP Procedure:23957} Future Appointments:No future appointments. Follow up Visit:

## 2022-02-05 NOTE — Lactation Note (Addendum)
This note was copied from a baby's chart. Lactation Consultation Note  Patient Name: Nicole Mckinney [id number] Date: 02/05/2022 Reason for consult: Initial assessment;Primapara;1st time breastfeeding;Term;Exclusive pumping and bottle feeding Age:23 hours  Visited with family of 66 hours old FT female; he was a NICU transit and just came back to mother's room on the 5th floor. [name] is a P1 and reports (+) breast changes during the pregnancy, she plans to exclusively pump and bottle feed, she inquired about bottle feeding. Showed parents the pace feeding technique since baby was already cueing and ready to eat, also showed parents how to burp baby. Reviewed pumping schedule, lactogenesis II and anticipatory guidelines.  Maternal Data Has patient been taught Hand Expression?: No Does the patient have breastfeeding experience prior to this delivery?: No  Feeding Mother's Current Feeding Choice: Breast Milk and Formula Nipple Type: Dr. Clement Husbands  Lactation Tools Discussed/Used Tools: Pump;Flanges Flange Size: 24 Breast pump type: Double-Electric Breast Pump Pump Education: Setup, frequency, and cleaning;Milk Storage Reason for Pumping: Mother's choice to pump and bottle feed Pumping frequency: q 3 hours (recommended) Pumped volume:  (droplets)  Interventions Interventions: Breast feeding basics reviewed;DEBP;Education;LC Services brochure;Pace feeding  Discharge Pump: Personal (DEBP at home)  Plan of care Encouraged pumping every 3 hours, at least 8 pumping sessions/24 hours Parents will continue working on bottle feedings  FOB present and supportive. All questions and concerns answered, family to contact [facility] services PRN.  Consult Status Consult Status: Follow-up Date: 02/06/22 Follow-up type: In-patient   Nicole Mckinney 02/05/2022, 1:26 PM

## 2022-02-05 NOTE — Anesthesia Postprocedure Evaluation (Signed)
Anesthesia Post Note  Patient: Nicole Mckinney  Procedure(s) Performed: AN AD HOC LABOR EPIDURAL     Patient location during evaluation: Mother Baby Anesthesia Type: Epidural Level of consciousness: awake, awake and alert and oriented Pain management: pain level controlled Vital Signs Assessment: post-procedure vital signs reviewed and stable Respiratory status: spontaneous breathing, respiratory function stable and nonlabored ventilation Cardiovascular status: stable Postop Assessment: adequate PO intake, able to ambulate, patient able to bend at knees, no apparent nausea or vomiting and no headache Anesthetic complications: no   No notable events documented.  Last Vitals:  Vitals:   02/05/22 0145 02/05/22 0520  BP: 93/65 95/62  Pulse: 93 80  Resp: 16 18  Temp: 36.7 C 36.7 C  SpO2: 99% 100%    Last Pain:  Vitals:   02/05/22 0845  TempSrc:   PainSc: 0-No pain   Pain Goal: Patients Stated Pain Goal: 0 (02/04/22 0029)                 Abigail Marsiglia

## 2022-02-05 NOTE — Progress Notes (Signed)
POSTPARTUM PROGRESS NOTE  Post Partum Day 1  Subjective:  Nicole Mckinney is a 21 y.o. G1P1001 s/p VAVD at [redacted]w[redacted]d.  She reports she is doing well. No acute events overnight. She denies any problems with ambulating, voiding or po intake. Denies nausea or vomiting.  Pain is moderately controlled.  Lochia is adequate.  Objective: Blood pressure 95/62, pulse 80, temperature 98 F (36.7 C), temperature source Oral, resp. rate 18, height 5\' 5"  (1.651 m), weight 80.1 kg, last menstrual period 04/22/2021, SpO2 100 %, unknown if currently breastfeeding.  Physical Exam:  General: alert, cooperative and no distress Chest: no respiratory distress Heart:regular rate, distal pulses intact Uterine Fundus: firm, appropriately tender DVT Evaluation: No calf swelling or tenderness Extremities: no edema Skin: warm, dry  Recent Labs    02/04/22 0259  HGB 12.2  HCT 37.1    Assessment/Plan: Nicole Mckinney is a 21 y.o. G1P1001 s/p VAVD at [redacted]w[redacted]d   PPD#1 - Doing well  Routine postpartum care  Goal is OOB and ambulating and improve pain control  Contraception: POPs Feeding: Breast Dispo: Plan for discharge tomorrow.   LOS: 1 day   [name], DO OB Fellow  02/05/2022, 7:53 AM

## 2022-02-05 NOTE — Lactation Note (Signed)
This note was copied from a baby's chart. Lactation Consultation Note  Patient Name: Nicole Mckinney [id number] Date: 02/05/2022 Reason for consult: Mother's request;Primapara;Term Age:23 hours Baby was transferred from NICU to mom's room. Baby hasn't ever been to breast. Mom has been pumping and giving her colostrum to NICU. Dad was giving baby formula when LC came into room. Mom stated she thought she might pump and bottle feed and give formula until her milk comes in. Asked mom if she would like to try to BF or if she wants to just pump and bottle feed that is fine. Mom showed [place] her breast tissue of inverted nipples. W/stimulation nipples everts. Lt. More so than Rt. Mom is pumping at this time when [facility] left. Mom will call for [facility] for next feeding to see if baby will latch.  Maternal Data    Feeding Nipple Type: Dr. Clement Husbands  [facility] Score                    Lactation Tools Discussed/Used Tools: Pump Breast pump type: Double-Electric Breast Pump  Interventions    Discharge    Consult Status Consult Status: Follow-up Date: 02/06/22 Follow-up type: In-patient    Theodoro Kalata 02/05/2022, 10:22 PM

## 2022-02-06 MED ORDER — NORETHINDRONE 0.35 MG PO TABS: 1.0000 | ORAL_TABLET | Freq: Every day | ORAL | 11 refills | Status: DC

## 2022-02-06 MED ORDER — IBUPROFEN 600 MG PO TABS: 600.0000 mg | ORAL_TABLET | Freq: Four times a day (QID) | ORAL | 0 refills | Status: DC

## 2022-02-06 MED ORDER — ACETAMINOPHEN 325 MG PO TABS: 650.0000 mg | ORAL_TABLET | ORAL | 0 refills | Status: AC | PRN

## 2022-02-06 NOTE — Discharge Instructions (Signed)

## 2022-02-06 NOTE — Lactation Note (Signed)
This note was copied from a baby's chart. Lactation Consultation Note  Patient Name: Nicole Mckinney [id number] Date: 02/06/2022 Reason for consult: Follow-up assessment;Term;Primapara Age:23 hours   P1: Term infant at 41+1 weeks Feeding preference: Breast milk (Mother plans to pump and bottle feed) Weight loss: 8%  Pediatrician in room when I arrived; planning to discharge infant this afternoon.  Reviewed current feeding plan.  Baby was originally admitted to the NICU for respiratory distress and has transitioned fairly quickly back to rooming in.  Mother has decided to pump and bottle feed only; does not desire to latch.  Discussed current weight loss of 8% and suggested parents begin to increase "Nicole Mckinney's" volumes to 30-60 mls every three to four hours.  Discussed burping frequently.  They were originally introduced to the Dr Saul Fordyce preemie nipple and father stated they have continued to keep him on this nipple.  Provided a few yellow slow flow nipples for parents to take home to experiment with.  Discussed proper assessment for a smooth transition to a new nipple and how to recognize if "Nicole Mckinney" has any difficulty with the flow rate.  Parents also have extra bottles and nipples for home use.  Suggested they continue a daily log of feedings, voids and stools until they return for their first pediatric visit on Wednesday.  Parents appreciative of advice and will gladly continue this plan.  They have our op phone number for any further questions/concerns.  RN updated.   Maternal Data    Feeding Mother's Current Feeding Choice: Breast Milk and Formula Nipple Type: Dr. Clement Husbands  LATCH Score                    Lactation Tools Discussed/Used    Interventions Interventions: Education  Discharge Discharge Education: Engorgement and breast care Pump: Personal  Consult Status Consult Status: Complete Date: 02/06/22 Follow-up type: Call as needed    Tristyn Pharris R  Drevin Ortner 02/06/2022, 5:07 PM

## 2022-02-06 NOTE — Lactation Note (Signed)
This note was copied from a baby's chart. Lactation Consultation Note  Patient Name: Nicole Mckinney [id number] Date: 02/06/2022 Reason for consult: Difficult latch;Mother's request;Primapara;Term Age:23 hours Mom called d/t time to feed baby. Mom wanted to try putting baby to the breast. Got mom to use DEBP to evert nipples while LC woke baby up by undressing him and changing a void diaper. Placed baby to Lt. Nipple and baby latched well after several attempts. Heard a couple of light swallows. Noted milk transfer from breast. Softer than before BF. Mom happy about that.  Mom gave baby her BM first then followed by formula. Gave mom shells to wear in am w/bra to evert nipples. Mom denied painful latch. Praised mom for a good feeding.  Maternal Data    Feeding Nipple Type: Dr. Clement Husbands  LATCH Score Latch: Grasps breast easily, tongue down, lips flanged, rhythmical sucking.  Audible Swallowing: A few with stimulation  Type of Nipple: Inverted  Comfort (Breast/Nipple): Filling, red/small blisters or bruises, mild/mod discomfort (breast starting to fill a little bit)  Hold (Positioning): Full assist, staff holds infant at breast  LATCH Score: 4   Lactation Tools Discussed/Used Tools: Pump;Flanges Flange Size: 21 Breast pump type: Double-Electric Breast Pump Reason for Pumping: pre-pump to evert nipples  Interventions Interventions: Breast feeding basics reviewed;Adjust position;DEBP;Assisted with latch;Support pillows;Skin to skin;Position options;Breast massage;Expressed milk;Hand express;Pace feeding;Pre-pump if needed;Shells;Reverse pressure;Breast compression  Discharge    Consult Status Consult Status: Follow-up Date: 02/06/22 Follow-up type: In-patient    Theodoro Kalata 02/06/2022, 1:54 AM

## 2022-02-07 LAB — SURGICAL PATHOLOGY

## 2022-02-15 ENCOUNTER — Encounter: Payer: Self-pay | Admitting: Advanced Practice Midwife

## 2022-02-15 ENCOUNTER — Other Ambulatory Visit: Payer: Self-pay | Admitting: Advanced Practice Midwife

## 2022-02-15 ENCOUNTER — Encounter: Payer: Self-pay | Admitting: Women's Health

## 2022-02-15 ENCOUNTER — Ambulatory Visit (INDEPENDENT_AMBULATORY_CARE_PROVIDER_SITE_OTHER): Payer: BC Managed Care – PPO | Admitting: *Deleted

## 2022-02-15 DIAGNOSIS — R300 Dysuria: Secondary | ICD-10-CM

## 2022-02-15 LAB — POCT URINALYSIS DIPSTICK OB
Glucose, UA: NEGATIVE
Ketones, UA: NEGATIVE
Nitrite, UA: NEGATIVE

## 2022-02-15 MED ORDER — ONDANSETRON 4 MG PO TBDP: 4.0000 mg | ORAL_TABLET | Freq: Three times a day (TID) | ORAL | 1 refills | Status: DC | PRN

## 2022-02-15 NOTE — Progress Notes (Signed)
   NURSE VISIT- UTI SYMPTOMS   SUBJECTIVE:  Nicole Mckinney is a 21 y.o. G25P1001 female here for UTI symptoms. She is postpartum. She reports chills, dysuria, fever 100.0, and nausea. Denies uterine tenderness as she did have chorio in labor and was treated with antibiotics.  OBJECTIVE:  There were no vitals taken for this visit.  Appears well, in no apparent distress  Results for orders placed or performed in visit on 02/15/22 (from the past 24 hour(s))  POC Urinalysis Dipstick OB   Collection Time: 02/15/22 10:54 AM  Result Value Ref Range   Color, UA     Clarity, UA     Glucose, UA Negative Negative   Bilirubin, UA     Ketones, UA neg    Spec Grav, UA     Blood, UA moderate    pH, UA     POC,PROTEIN,UA Large (3+) Negative, Trace, Small (1+), Moderate (2+), Large (3+), 4+   Urobilinogen, UA     Nitrite, UA neg    Leukocytes, UA Small (1+) (A) Negative   Appearance     Odor      ASSESSMENT: Postpartum with UTI symptoms and negative nitrites  PLAN: Discussed with Philipp Deputy, CNM   Rx sent by provider today: No Requesting nausea medication Urine culture sent Call or return to clinic prn if these symptoms worsen or fail to improve as anticipated. Follow-up: as scheduled   Jobe Marker  02/15/2022 10:56 AM

## 2022-02-16 ENCOUNTER — Other Ambulatory Visit: Payer: Self-pay | Admitting: Obstetrics & Gynecology

## 2022-02-16 ENCOUNTER — Encounter: Payer: Self-pay | Admitting: Women's Health

## 2022-02-16 LAB — MICROSCOPIC EXAMINATION
Bacteria, UA: NONE SEEN
Casts: NONE SEEN /lpf
WBC, UA: >30 /hpf — AB (ref 0–5)

## 2022-02-16 LAB — URINALYSIS, ROUTINE W REFLEX MICROSCOPIC
Bilirubin, UA: NEGATIVE
Glucose, UA: NEGATIVE
Nitrite, UA: NEGATIVE
Specific Gravity, UA: >=1.03 — AB (ref 1.005–1.030)
Urobilinogen, Ur: 0.2 mg/dL (ref 0.2–1.0)
pH, UA: 7.5 (ref 5.0–7.5)

## 2022-02-16 MED ORDER — DICLOXACILLIN SODIUM 500 MG PO CAPS: 500.0000 mg | ORAL_CAPSULE | Freq: Four times a day (QID) | ORAL | 0 refills | Status: DC

## 2022-02-17 ENCOUNTER — Encounter: Payer: Self-pay | Admitting: Advanced Practice Midwife

## 2022-02-18 LAB — URINE CULTURE

## 2022-03-14 ENCOUNTER — Encounter: Payer: Self-pay | Admitting: Women's Health

## 2022-03-14 ENCOUNTER — Ambulatory Visit (INDEPENDENT_AMBULATORY_CARE_PROVIDER_SITE_OTHER): Payer: BC Managed Care – PPO | Admitting: Women's Health

## 2022-03-14 DIAGNOSIS — Z30011 Encounter for initial prescription of contraceptive pills: Secondary | ICD-10-CM

## 2022-03-14 DIAGNOSIS — Z8759 Personal history of other complications of pregnancy, childbirth and the puerperium: Secondary | ICD-10-CM

## 2022-03-14 MED ORDER — LO LOESTRIN FE 1 MG-10 MCG / 10 MCG PO TABS: 1.0000 | ORAL_TABLET | Freq: Every day | ORAL | 3 refills | Status: DC

## 2022-03-14 NOTE — Progress Notes (Signed)
POSTPARTUM VISIT Patient name: Nicole Mckinney MRN [id number]  Date of birth: Apr 03, 2001 Chief Complaint:   Postpartum Care  History of Present Illness:   Nicole Mckinney is a 21 y.o. G48P1001 Caucasian female being seen today for a postpartum visit. She is 5 weeks postpartum following a vacuum, low vaginal birth at 41.1 gestational weeks. IOL: no, for n/a. Anesthesia: epidural.  Laceration: 2nd degree.  Complications: chorio, mild PPH 78m d/t atony (pit/TXA). Inpatient contraception: yes rx'd micronor at d/c, taking now .   Pregnancy uncomplicated. Tobacco use: no. Substance use disorder: no. Last pap smear: 07/18/21 and results were NILM w/ HRHPV not done. Next pap smear due: 2026 No LMP recorded.  Postpartum course has been complicated by Rt mastitis about 3wks ago, better after antibiotics, that breast still tender . Bleeding none. Bowel function is normal. Bladder function is normal. Urinary incontinence? no, fecal incontinence? no Patient is not sexually active. Last sexual activity: prior to birth of baby. Desired contraception: OCPs (no longer breastfeeding). Does not smoke, no h/o HTN, DVT/PE, CVA, MI, or migraines w/ aura.  Patient does want a pregnancy in the future.  Desired family size is 2-3 children.   Upstream - 03/14/22 1139       Pregnancy Intention Screening   Does the patient want to become pregnant in the next year? No    Does the patient's partner want to become pregnant in the next year? No    Would the patient like to discuss contraceptive options today? Yes      Contraception Wrap Up   Current Method Oral Contraceptive    End Method Oral Contraceptive    Contraception Counseling Provided Yes            The pregnancy intention screening data noted above was reviewed. Potential methods of contraception were discussed. The patient elected to proceed with Oral Contraceptive.  Edinburgh Postpartum Depression Screening: negative  Edinburgh Postnatal  Depression Scale - 03/14/22 1134       Edinburgh Postnatal Depression Scale:  In the Past 7 Days   I have been able to laugh and see the funny side of things. 0    I have looked forward with enjoyment to things. 0    I have blamed myself unnecessarily when things went wrong. 0    I have been anxious or worried for no good reason. 1    I have felt scared or panicky for no good reason. 0    Things have been getting on top of me. 0    I have been so unhappy that I have had difficulty sleeping. 0    I have felt sad or miserable. 0    I have been so unhappy that I have been crying. 0    The thought of harming myself has occurred to me. 0    Edinburgh Postnatal Depression Scale Total 1                11/01/2021    8:59 AM 07/18/2021    2:01 PM  GAD 7 : Generalized Anxiety Score  Nervous, Anxious, on Edge 1 1  Control/stop worrying 0 1  Worry too much - different things 1 1  Trouble relaxing 0 0  Restless 1 0  Easily annoyed or irritable 1 0  Afraid - awful might happen 0 0  Total GAD 7 Score 4 3     Baby's course has been uncomplicated. Baby is feeding by breast at first, now  bottle. Infant has a pediatrician/family doctor? Yes.  Childcare strategy if returning to work/school: family.  Pt has material needs met for her and baby: Yes.   Review of Systems:   Pertinent items are noted in HPI Denies Abnormal vaginal discharge w/ itching/odor/irritation, headaches, visual changes, shortness of breath, chest pain, abdominal pain, severe nausea/vomiting, or problems with urination or bowel movements. Pertinent History Reviewed:  Reviewed past medical,surgical, obstetrical and family history.  Reviewed problem list, medications and allergies. OB History  Gravida Para Term Preterm AB Living  _0 0 0 1  SAB IAB Ectopic Multiple Live Births  0 0 0 0 1    # Outcome Date GA Lbr Len/2nd Weight Sex Delivery Anes PTL Lv  1 Term 02/04/22 [id number]:10 / 01:33 8 lb 5 oz (3.77 kg) M Vag-Vacuum  EPI  LIV   Physical Assessment:   Vitals:   03/14/22 1131  BP: 117/73  Pulse: 98  Weight: 146 lb (66.2 kg)  Height: _1  (1.651 m)  Body mass index is 24.3 kg/m.       Physical Examination:   General appearance: alert, well appearing, and in no distress  Mental status: alert, oriented to person, place, and time  Skin: warm & dry   Cardiovascular: normal heart rate noted   Respiratory: normal respiratory effort, no distress   Breasts: wnl, no erythema, hardness, streaks. Rt nipple inverted- per pt always has been, no change  Abdomen: soft, non-tender   Pelvic: lac well healed. Thin prep pap obtained: No  Rectal: not examined  Extremities: Edema: none   Chaperone: ACelene Squibb        No results found for this or any previous visit (from the past 24 hour(s)).  Assessment & Plan:  1) Postpartum exam 2) 5 wks s/p vacuum, low vag birth 3) bottle feeding 4) Depression screening 5) Contraception management: stop micronor, rx LoLo, condoms x 2wks, f/u 31ms 6) Rt breast tenderness> s/p mastitis 3wks ago, normal exam, if not improved in a few more weeks let usKoreanow  Essential components of care per ACOG recommendations:  1.  Mood and well being:  If positive depression screen, discussed and plan developed.  If using tobacco we discussed reduction/cessation and risk of relapse If current substance abuse, we discussed and referral to local resources was offered.   2. Infant care and feeding:  If breastfeeding, discussed returning to work, pumping, breastfeeding-associated pain, guidance regarding return to fertility while lactating if not using another method. If needed, patient was provided with a letter to be allowed to pump q 2-3hrs to support lactation in a private location with access to a refrigerator to store breastmilk.   Recommended that all caregivers be immunized for flu, pertussis and other preventable communicable diseases If pt does not have material needs met for  her/baby, referred to local resources for help obtaining these.  3. Sexuality, contraception and birth spacing Provided guidance regarding sexuality, management of dyspareunia, and resumption of intercourse Discussed avoiding interpregnancy interval <58m28m and recommended birth spacing of 18 months  4. Sleep and fatigue Discussed coping options for fatigue and sleep disruption Encouraged family/partner/community support of 4 hrs of uninterrupted sleep to help with mood and fatigue  5. Physical recovery  If pt had a C/S, assessed incisional pain and providing guidance on normal vs prolonged recovery If pt had a laceration, perineal healing and pain reviewed.  If urinary or fecal incontinence, discussed management and referred to PT or uro/gyn if  indicated  Patient is safe to resume physical activity. Discussed attainment of healthy weight.  6.  Chronic disease management Discussed pregnancy complications if any, and their implications for future childbearing and long-term maternal health. Review recommendations for prevention of recurrent pregnancy complications, such as 17 hydroxyprogesterone caproate to reduce risk for recurrent PTB not applicable, or aspirin to reduce risk of preeclampsia not applicable. Pt had GDM: no. If yes, 2hr GTT scheduled: not applicable. Reviewed medications and non-pregnant dosing including consideration of whether pt is breastfeeding using a reliable resource such as LactMed: not applicable Referred for f/u w/ PCP or subspecialist providers as indicated: not applicable  7. Health maintenance Mammogram at 23yo or earlier if indicated Pap smears as indicated  Meds:  Meds ordered this encounter  Medications   LO LOESTRIN FE 1 MG-10 MCG / 10 MCG tablet    Sig: Take 1 tablet by mouth daily.    Dispense:  90 tablet    Refill:  3    For co-pay card, pt to text "Lo Loestrin Fe " to [phone]              Co-pay card must be run in second position  "other coverage  code 3"  if denied d/t PA, step edit, or insurance denial    Follow-up: Return in about 3 months (around 06/13/2022) for med f/u, CNM, in person.   No orders of the defined types were placed in this encounter.   [place], [facility] 03/14/2022 12:15 PM

## 2022-06-13 ENCOUNTER — Encounter: Payer: Self-pay | Admitting: Women's Health

## 2022-06-13 ENCOUNTER — Ambulatory Visit: Payer: BC Managed Care – PPO | Admitting: Women's Health

## 2022-06-13 VITALS — BP 123/74 | HR 84 | Wt 141.0 lb

## 2022-06-13 DIAGNOSIS — Z3041 Encounter for surveillance of contraceptive pills: Secondary | ICD-10-CM | POA: Diagnosis not present

## 2022-06-13 NOTE — Progress Notes (Signed)
   GYN VISIT Patient name: Nicole Mckinney MRN [id number]  Date of birth: January 05, 2001 Chief Complaint:   Follow-up (F/u on LoLoestrin, insurance will not cover now-unable to get coupon)  History of Present Illness:   Nicole Mckinney is a 22 y.o. G53P1001 Caucasian female being seen today for f/u on LoLoestrin rx'd 03/14/22. Insurance covered it x 13mth, then wouldn't, so she went back to [facility]. Did like the LoLoestrin. Pharmacy said they couldn't run the coupon card b/c there system was down.     No LMP recorded. (Menstrual status: Irregular Periods). The current method of family planning is oral progesterone-only contraceptive.  Last pap 07/18/21. Results were: NILM w/ HRHPV not done     11/01/2021    8:58 AM 07/18/2021    2:01 PM 08/01/2017    8:46 AM  Depression screen PHQ 2/9  Decreased Interest 0 0 0  Down, Depressed, Hopeless 0 0 0  PHQ - 2 Score 0 0 0  Altered sleeping 0 0   Tired, decreased energy 1 1   Change in appetite 0 0   Feeling bad or failure about yourself  0 0   Trouble concentrating 0 0   Moving slowly or fidgety/restless 0 0   Suicidal thoughts 0 0   PHQ-9 Score 1 1         11/01/2021    8:59 AM 07/18/2021    2:01 PM  GAD 7 : Generalized Anxiety Score  Nervous, Anxious, on Edge 1 1  Control/stop worrying 0 1  Worry too much - different things 1 1  Trouble relaxing 0 0  Restless 1 0  Easily annoyed or irritable 1 0  Afraid - awful might happen 0 0  Total GAD 7 Score 4 3     Review of Systems:   Pertinent items are noted in HPI Denies fever/chills, dizziness, headaches, visual disturbances, fatigue, shortness of breath, chest pain, abdominal pain, vomiting, abnormal vaginal discharge/itching/odor/irritation, problems with periods, bowel movements, urination, or intercourse unless otherwise stated above.  Pertinent History Reviewed:  Reviewed past medical,surgical, social, obstetrical and family history.  Reviewed problem list, medications and  allergies. Physical Assessment:   Vitals:   06/13/22 0857  BP: 123/74  Pulse: 84  Weight: 141 lb (64 kg)  Body mass index is 23.46 kg/m.       Physical Examination:   General appearance: alert, well appearing, and in no distress  Mental status: alert, oriented to person, place, and time  Skin: warm & dry   Cardiovascular: normal heart rate noted  Respiratory: normal respiratory effort, no distress  Abdomen: soft, non-tender   Pelvic: examination not indicated  Extremities: no edema   Chaperone: N/A    No results found for this or any previous visit (from the past 24 hour(s)).  Assessment & Plan:  1) Contraception surveillance> wants to try to get LoLo covered by insurance, helped her fill out coupon online so she can try to show to pharmacy again. If any issues, let us know and will switch to Loestrin.   Meds: No orders of the defined types were placed in this encounter.   No orders of the defined types were placed in this encounter.   Return in about 1 year (around 06/13/2023) for Physical.  [place], [facility] 06/13/2022 9:27 AM

## 2022-11-28 ENCOUNTER — Other Ambulatory Visit: Payer: Self-pay | Admitting: Women's Health

## 2022-11-28 ENCOUNTER — Encounter: Payer: Self-pay | Admitting: Women's Health

## 2022-11-28 MED ORDER — LO LOESTRIN FE 1 MG-10 MCG / 10 MCG PO TABS: 1.0000 | ORAL_TABLET | Freq: Every day | ORAL | 3 refills | Status: DC

## 2022-11-30 ENCOUNTER — Other Ambulatory Visit: Payer: Self-pay | Admitting: Women's Health

## 2022-11-30 ENCOUNTER — Telehealth: Payer: Self-pay

## 2022-11-30 MED ORDER — NORETHIN ACE-ETH ESTRAD-FE 1-20 MG-MCG(24) PO TABS: 1.0000 | ORAL_TABLET | Freq: Every day | ORAL | 3 refills | Status: DC

## 2022-11-30 NOTE — Telephone Encounter (Signed)
Patient called and stated that the LO LO was called into the pharmacy but, she does not take the LO LO because it was to expensive she needs the NOR called in.

## 2023-10-28 ENCOUNTER — Other Ambulatory Visit: Payer: Self-pay | Admitting: Women's Health

## 2023-10-30 ENCOUNTER — Encounter: Payer: Self-pay | Admitting: Women's Health

## 2023-10-30 ENCOUNTER — Other Ambulatory Visit: Payer: Self-pay | Admitting: Women's Health

## 2024-01-16 ENCOUNTER — Ambulatory Visit: Admitting: Women's Health

## 2024-01-19 ENCOUNTER — Other Ambulatory Visit: Payer: Self-pay | Admitting: Women's Health

## 2024-04-04 ENCOUNTER — Ambulatory Visit: Admitting: *Deleted

## 2024-04-04 VITALS — BP 112/68 | HR 113 | Ht 66.0 in | Wt 140.0 lb

## 2024-04-04 DIAGNOSIS — Z3201 Encounter for pregnancy test, result positive: Secondary | ICD-10-CM

## 2024-04-04 DIAGNOSIS — N926 Irregular menstruation, unspecified: Secondary | ICD-10-CM

## 2024-04-04 LAB — POCT URINE PREGNANCY: Preg Test, Ur: POSITIVE — AB

## 2024-04-04 NOTE — Progress Notes (Signed)
" ° °  NURSE VISIT- PREGNANCY CONFIRMATION   SUBJECTIVE:  Nicole Mckinney is a 24 y.o. G25P1001 female at [redacted]w[redacted]d by certain LMP of Patient's last menstrual period was 02/19/2024. Here for pregnancy confirmation.  Home pregnancy test: positive x 4  She reports no complaints.  She is not taking prenatal vitamins.    OBJECTIVE:  BP 112/68 (BP Location: Right Arm, Patient Position: Sitting, Cuff Size: Normal)   Pulse (!) 113   Ht 5' 6 (1.676 m)   Wt 140 lb (63.5 kg)   LMP 02/19/2024   BMI 22.60 kg/m   Appears well, in no apparent distress  Results for orders placed or performed in visit on 04/04/24 (from the past 24 hours)  POCT urine pregnancy   Collection Time: 04/04/24  8:44 AM  Result Value Ref Range   Preg Test, Ur Positive (A) Negative    ASSESSMENT: Positive pregnancy test, [redacted]w[redacted]d by LMP    PLAN: Schedule for dating ultrasound in 1 weeks Prenatal vitamins: plans to begin OTC ASAP   Nausea medicines: not currently needed   OB packet given: Yes  Arihana Ambrocio  04/04/2024 8:50 AM  "

## 2024-04-23 ENCOUNTER — Other Ambulatory Visit: Payer: Self-pay | Admitting: Obstetrics & Gynecology

## 2024-04-23 DIAGNOSIS — O3680X0 Pregnancy with inconclusive fetal viability, not applicable or unspecified: Secondary | ICD-10-CM

## 2024-04-24 ENCOUNTER — Ambulatory Visit (INDEPENDENT_AMBULATORY_CARE_PROVIDER_SITE_OTHER): Payer: Self-pay

## 2024-04-24 DIAGNOSIS — O3680X0 Pregnancy with inconclusive fetal viability, not applicable or unspecified: Secondary | ICD-10-CM

## 2024-04-24 DIAGNOSIS — Z3A09 9 weeks gestation of pregnancy: Secondary | ICD-10-CM

## 2024-04-24 DIAGNOSIS — Z3687 Encounter for antenatal screening for uncertain dates: Secondary | ICD-10-CM

## 2024-04-24 NOTE — Progress Notes (Signed)
 US  9+2 wks,single IUP with yolk sac,FHR 180 bpm,normal ovaries,anterior placenta,CRL 22.97 mm

## 2024-04-25 ENCOUNTER — Other Ambulatory Visit

## 2024-05-04 ENCOUNTER — Encounter: Payer: Self-pay | Admitting: *Deleted

## 2024-05-05 ENCOUNTER — Encounter: Payer: Self-pay | Admitting: Women's Health

## 2024-05-05 ENCOUNTER — Ambulatory Visit: Payer: Self-pay | Admitting: *Deleted

## 2024-05-07 ENCOUNTER — Encounter: Payer: Self-pay | Admitting: Women's Health

## 2024-05-07 DIAGNOSIS — Z3490 Encounter for supervision of normal pregnancy, unspecified, unspecified trimester: Secondary | ICD-10-CM | POA: Insufficient documentation

## 2024-05-08 ENCOUNTER — Encounter: Payer: Self-pay | Admitting: *Deleted

## 2024-05-08 ENCOUNTER — Ambulatory Visit: Payer: Self-pay | Admitting: Women's Health

## 2024-05-08 ENCOUNTER — Encounter: Payer: Self-pay | Admitting: Women's Health

## 2024-05-08 VITALS — BP 130/81 | HR 86 | Wt 146.0 lb

## 2024-05-08 DIAGNOSIS — Z113 Encounter for screening for infections with a predominantly sexual mode of transmission: Secondary | ICD-10-CM

## 2024-05-08 DIAGNOSIS — Z3480 Encounter for supervision of other normal pregnancy, unspecified trimester: Secondary | ICD-10-CM

## 2024-05-08 DIAGNOSIS — Z3481 Encounter for supervision of other normal pregnancy, first trimester: Secondary | ICD-10-CM

## 2024-05-08 DIAGNOSIS — Z131 Encounter for screening for diabetes mellitus: Secondary | ICD-10-CM

## 2024-05-08 DIAGNOSIS — F419 Anxiety disorder, unspecified: Secondary | ICD-10-CM

## 2024-05-08 DIAGNOSIS — Z3A11 11 weeks gestation of pregnancy: Secondary | ICD-10-CM

## 2024-05-08 MED ORDER — HYDROXYZINE HCL 10 MG PO TABS: 10.0000 mg | ORAL_TABLET | Freq: Every day | ORAL | 6 refills | Status: AC

## 2024-05-08 MED ORDER — PRENATAL PLUS 27-1 MG PO TABS: ORAL_TABLET | ORAL | 3 refills | Status: AC

## 2024-05-08 NOTE — Patient Instructions (Signed)
 Joesph, thank you for choosing our office today! We appreciate the opportunity to meet your healthcare needs. You may receive a short survey by mail, e-mail, or through [employer]. If you are happy with your care we would appreciate if you could take just a few minutes to complete the survey questions. We read all of your comments and take your feedback very seriously. Thank you again for choosing our office.  [facility] [employer] Team at [facility]  [facility] ([address] [place], [place] [address]) Entrance C, located off of [address] Free 24/7 valet parking   For Dizzy Spells:  This is usually related to either your blood sugar or your blood pressure dropping Make sure you are staying well hydrated and drinking enough water so that your urine is clear Eat small frequent meals and snacks containing protein (meat, eggs, nuts, cheese) so that your blood sugar doesn't drop If you do get dizzy, sit/lay down and get you something to drink and a snack containing protein- you will usually start feeling better in 10-20 minutes    Nausea & Vomiting Have saltine crackers or pretzels by your bed and eat a few bites before you raise your head out of bed in the morning Eat small frequent meals throughout the day instead of large meals Drink plenty of fluids throughout the day to stay hydrated, just don't drink a lot of fluids with your meals.  This can make your stomach fill up faster making you feel sick Do not brush your teeth right after you eat Products with real ginger are good for nausea, like ginger ale and ginger hard candy Make sure it says made with real ginger! Sucking on sour candy like lemon heads is also good for nausea If your prenatal vitamins make you nauseated, take them at night so you will sleep through the nausea Sea Bands If you feel like you need medicine for the nausea & vomiting please let us  know If you are unable to keep any fluids or  food down please let us  know   Constipation Drink plenty of fluid, preferably water, throughout the day Eat foods high in fiber such as fruits, vegetables, and grains Exercise, such as walking, is a good way to keep your bowels regular Drink warm fluids, especially warm prune juice, or decaf coffee Eat a 1/2 cup of real oatmeal (not instant), 1/2 cup applesauce, and 1/2-1 cup warm prune juice every day If needed, you may take Colace (docusate sodium ) stool softener once or twice a day to help keep the stool soft.  If you still are having problems with constipation, you may take Miralax once daily as needed to help keep your bowels regular.   Home Blood Pressure Monitoring for Patients   Your provider has recommended that you check your blood pressure (BP) at least once a week at home. If you do not have a blood pressure cuff at home, one will be provided for you. Contact your provider if you have not received your monitor within 1 week.   Helpful Tips for Accurate Home Blood Pressure Checks  Don't smoke, exercise, or drink caffeine 30 minutes before checking your BP Use the restroom before checking your BP (a full bladder can raise your pressure) Relax in a comfortable upright chair Feet on the ground Left arm resting comfortably on a flat surface at the level of your heart Legs uncrossed Back supported Sit quietly and don't talk Place the  cuff on your bare arm Adjust snuggly, so that only two fingertips can fit between your skin and the top of the cuff Check 2 readings separated by at least one minute Keep a log of your BP readings For a visual, please reference this diagram: http://ccnc.care/bpdiagram  Provider Name: [facility]     Phone: [phone]  Zone 1: ALL CLEAR  Continue to monitor your symptoms:  BP reading is less than 140 (top number) or less than 90 (bottom number)  No right upper stomach pain No headaches or seeing spots No feeling nauseated or throwing  up No swelling in face and hands  Zone 2: CAUTION Call your doctor's office for any of the following:  BP reading is greater than 140 (top number) or greater than 90 (bottom number)  Stomach pain under your ribs in the middle or right side Headaches or seeing spots Feeling nauseated or throwing up Swelling in face and hands  Zone 3: EMERGENCY  Seek immediate medical care if you have any of the following:  BP reading is greater than160 (top number) or greater than 110 (bottom number) Severe headaches not improving with Tylenol  Serious difficulty catching your breath Any worsening symptoms from Zone 2    First Trimester of Pregnancy The first trimester of pregnancy is from week 1 until the end of week 12 (months 1 through 3). A week after a sperm fertilizes an egg, the egg will implant on the wall of the uterus. This embryo will begin to develop into a baby. Genes from you and your partner are forming the baby. The female genes determine whether the baby is a boy or a girl. At 6-8 weeks, the eyes and face are formed, and the heartbeat can be seen on ultrasound. At the end of 12 weeks, all the baby's organs are formed.  Now that you are pregnant, you will want to do everything you can to have a healthy baby. Two of the most important things are to get good prenatal care and to follow your health care provider's instructions. Prenatal care is all the medical care you receive before the baby's birth. This care will help prevent, find, and treat any problems during the pregnancy and childbirth. BODY CHANGES Your body goes through many changes during pregnancy. The changes vary from woman to woman.  You may gain or lose a couple of pounds at first. You may feel sick to your stomach (nauseous) and throw up (vomit). If the vomiting is uncontrollable, call your health care provider. You may tire easily. You may develop headaches that can be relieved by medicines approved by your health care  provider. You may urinate more often. Painful urination may mean you have a bladder infection. You may develop heartburn as a result of your pregnancy. You may develop constipation because certain hormones are causing the muscles that push waste through your intestines to slow down. You may develop hemorrhoids or swollen, bulging veins (varicose veins). Your breasts may begin to grow larger and become tender. Your nipples may stick out more, and the tissue that surrounds them (areola) may become darker. Your gums may bleed and may be sensitive to brushing and flossing. Dark spots or blotches (chloasma, mask of pregnancy) may develop on your face. This will likely fade after the baby is born. Your menstrual periods will stop. You may have a loss of appetite. You may develop cravings for certain kinds of food. You may have changes in your emotions from day to day,  such as being excited to be pregnant or being concerned that something may go wrong with the pregnancy and baby. You may have more vivid and strange dreams. You may have changes in your hair. These can include thickening of your hair, rapid growth, and changes in texture. Some women also have hair loss during or after pregnancy, or hair that feels dry or thin. Your hair will most likely return to normal after your baby is born. WHAT TO EXPECT AT YOUR PRENATAL VISITS During a routine prenatal visit: You will be weighed to make sure you and the baby are growing normally. Your blood pressure will be taken. Your abdomen will be measured to track your baby's growth. The fetal heartbeat will be listened to starting around week 10 or 12 of your pregnancy. Test results from any previous visits will be discussed. Your health care provider may ask you: How you are feeling. If you are feeling the baby move. If you have had any abnormal symptoms, such as leaking fluid, bleeding, severe headaches, or abdominal cramping. If you have any  questions. Other tests that may be performed during your first trimester include: Blood tests to find your blood type and to check for the presence of any previous infections. They will also be used to check for low iron  levels (anemia) and Rh antibodies. Later in the pregnancy, blood tests for diabetes will be done along with other tests if problems develop. Urine tests to check for infections, diabetes, or protein in the urine. An ultrasound to confirm the proper growth and development of the baby. An amniocentesis to check for possible genetic problems. Fetal screens for spina bifida and Down syndrome. You may need other tests to make sure you and the baby are doing well. HOME CARE INSTRUCTIONS  Medicines Follow your health care provider's instructions regarding medicine use. Specific medicines may be either safe or unsafe to take during pregnancy. Take your prenatal vitamins as directed. If you develop constipation, try taking a stool softener if your health care provider approves. Diet Eat regular, well-balanced meals. Choose a variety of foods, such as meat or vegetable-based protein, fish, milk and low-fat dairy products, vegetables, fruits, and whole grain breads and cereals. Your health care provider will help you determine the amount of weight gain that is right for you. Avoid raw meat and uncooked cheese. These carry germs that can cause birth defects in the baby. Eating four or five small meals rather than three large meals a day may help relieve nausea and vomiting. If you start to feel nauseous, eating a few soda crackers can be helpful. Drinking liquids between meals instead of during meals also seems to help nausea and vomiting. If you develop constipation, eat more high-fiber foods, such as fresh vegetables or fruit and whole grains. Drink enough fluids to keep your urine clear or pale yellow. Activity and Exercise Exercise only as directed by your health care provider. Exercising  will help you: Control your weight. Stay in shape. Be prepared for labor and delivery. Experiencing pain or cramping in the lower abdomen or low back is a good sign that you should stop exercising. Check with your health care provider before continuing normal exercises. Try to avoid standing for long periods of time. Move your legs often if you must stand in one place for a long time. Avoid heavy lifting. Wear low-heeled shoes, and practice good posture. You may continue to have sex unless your health care provider directs you otherwise. Relief of Pain  or Discomfort Wear a good support bra for breast tenderness.   Take warm sitz baths to soothe any pain or discomfort caused by hemorrhoids. Use hemorrhoid cream if your health care provider approves.   Rest with your legs elevated if you have leg cramps or low back pain. If you develop varicose veins in your legs, wear support hose. Elevate your feet for 15 minutes, 3-4 times a day. Limit salt in your diet. Prenatal Care Schedule your prenatal visits by the twelfth week of pregnancy. They are usually scheduled monthly at first, then more often in the last 2 months before delivery. Write down your questions. Take them to your prenatal visits. Keep all your prenatal visits as directed by your health care provider. Safety Wear your seat belt at all times when driving. Make a list of emergency phone numbers, including numbers for family, friends, the hospital, and police and fire departments. General Tips Ask your health care provider for a referral to a local prenatal education class. Begin classes no later than at the beginning of month 6 of your pregnancy. Ask for help if you have counseling or nutritional needs during pregnancy. Your health care provider can offer advice or refer you to specialists for help with various needs. Do not use hot tubs, steam rooms, or saunas. Do not douche or use tampons or scented sanitary pads. Do not cross your  legs for long periods of time. Avoid cat litter boxes and soil used by cats. These carry germs that can cause birth defects in the baby and possibly loss of the fetus by miscarriage or stillbirth. Avoid all smoking, herbs, alcohol, and medicines not prescribed by your health care provider. Chemicals in these affect the formation and growth of the baby. Schedule a dentist appointment. At home, brush your teeth with a soft toothbrush and be gentle when you floss. SEEK MEDICAL CARE IF:  You have dizziness. You have mild pelvic cramps, pelvic pressure, or nagging pain in the abdominal area. You have persistent nausea, vomiting, or diarrhea. You have a bad smelling vaginal discharge. You have pain with urination. You notice increased swelling in your face, hands, legs, or ankles. SEEK IMMEDIATE MEDICAL CARE IF:  You have a fever. You are leaking fluid from your vagina. You have spotting or bleeding from your vagina. You have severe abdominal cramping or pain. You have rapid weight gain or loss. You vomit blood or material that looks like coffee grounds. You are exposed to German measles and have never had them. You are exposed to fifth disease or chickenpox. You develop a severe headache. You have shortness of breath. You have any kind of trauma, such as from a fall or a car accident. Document Released: 03/14/2001 Document Revised: 08/04/2013 Document Reviewed: 01/28/2013 [facility] Patient Information 2015 [place], [place]. This information is not intended to replace advice given to you by your health care provider. Make sure you discuss any questions you have with your health care provider.

## 2024-05-08 NOTE — Progress Notes (Signed)
 "   INITIAL OBSTETRICAL VISIT Patient name: Nicole Mckinney MRN [id number]  Date of birth: [birth year] Chief Complaint:   Initial Prenatal Visit (2 episodes of feeling like she was going to pass out yesterday)  History of Present Illness:   Nicole Mckinney is a 24 y.o. G11P1001 Caucasian female at [redacted]w[redacted]d by LMP c/w u/s at 9 weeks with an Estimated Date of Delivery: 11/25/24 being seen today for her initial obstetrical visit.   Patient's last menstrual period was 02/19/2024. Her obstetrical history is significant for term SVB w/ PPH (TXA only).   Today she reports 2 episodes yesterday felt like was going to pass out. Had been snacking throughout the day.Was just sitting when it happened.  Anxiety is bad, feels needs to be on meds. No depression at all. OK w/ [facility] referral Requests rx for pnv  Last pap 07/18/21. Results were: NILM w/ HRHPV not done     05/08/2024    2:07 PM 11/01/2021    8:58 AM 07/18/2021    2:01 PM 08/01/2017    8:46 AM  Depression screen PHQ 2/9  Decreased Interest 1 0 0 0  Down, Depressed, Hopeless 0 0 0 0  PHQ - 2 Score 1 0 0 0  Altered sleeping 2 0 0   Tired, decreased energy 2 1 1    Change in appetite 1 0 0   Feeling bad or failure about yourself  0 0 0   Trouble concentrating 1 0 0   Moving slowly or fidgety/restless 0 0 0   Suicidal thoughts 0 0 0   PHQ-9 Score 7 1  1        Data saved with a previous flowsheet row definition        05/08/2024    2:07 PM 11/01/2021    8:59 AM 07/18/2021    2:01 PM  GAD 7 : Generalized Anxiety Score  Nervous, Anxious, on Edge 2 1  1    Control/stop worrying 2 0  1   Worry too much - different things 2 1  1    Trouble relaxing 2 0  0   Restless 1 1  0   Easily annoyed or irritable 1 1  0   Afraid - awful might happen 2 0  0   Total GAD 7 Score 12 4 3      Data saved with a previous flowsheet row definition     Review of Systems:   Pertinent items are noted in HPI Denies cramping/contractions, leakage of fluid, vaginal  bleeding, abnormal vaginal discharge w/ itching/odor/irritation, headaches, visual changes, shortness of breath, chest pain, abdominal pain, severe nausea/vomiting, or problems with urination or bowel movements unless otherwise stated above.  Pertinent History Reviewed:  Reviewed past medical,surgical, social, obstetrical and family history.  Reviewed problem list, medications and allergies. OB History  Gravida Para Term Preterm AB Living  2 1 1  0 0 1  SAB IAB Ectopic Multiple Live Births  0 0 0 0 1    # Outcome Date GA Lbr Len/2nd Weight Sex Type Anes PTL Lv  2 Current           1 Term 02/04/22 [redacted]w[redacted]d 13:10 / 01:33 8 lb 5 oz (3.77 kg) M Vag-Vacuum EPI  LIV     Complications: Postpartum hemorrhage   Physical Assessment:   Vitals:   05/08/24 1409  BP: 130/81  Pulse: 86  Weight: 146 lb (66.2 kg)  Body mass index is 23.57 kg/m.  Physical Examination:  General appearance - well appearing, and in no distress  Mental status - alert, oriented to person, place, and time  Psych:  She has a normal mood and affect  Skin - warm and dry, normal color, no suspicious lesions noted  Chest - effort normal, all lung fields clear to auscultation bilaterally  Heart - normal rate and regular rhythm  Abdomen - soft, nontender  Extremities:  No swelling or varicosities noted  Thin prep pap is not done   Chaperone: N/A  TODAY'S informal TA u/s: present FCA and active fetus  No results found for this or any previous visit (from the past 24 hours).  Assessment & Plan:  1) Low-Risk Pregnancy G2P1001 at [redacted]w[redacted]d with an Estimated Date of Delivery: 11/25/24   2) Initial OB visit  3) H/O PPH> plan TXA  4) Anxiety> rx vistaril  (wants to take once daily), no depression at all. [facility] referral  5) Near syncopal episodes yesterday> checking hgb today, discussed most common reasons, gavep printed info   Meds:  Meds ordered this encounter  Medications   hydrOXYzine  (ATARAX ) 10 MG tablet    Sig: Take  1 tablet (10 mg total) by mouth daily.    Dispense:  30 tablet    Refill:  6   prenatal vitamin w/FE, FA (PRENATAL 1 + 1) 27-1 MG TABS tablet    Sig: Take 1 tablet by mouth daily    Dispense:  90 tablet    Refill:  3    Initial labs obtained Continue prenatal vitamins Reviewed n/v relief measures and warning s/s to report Reviewed recommended weight gain based on pre-gravid BMI Encouraged well-balanced diet Genetic & carrier screening discussed: requests Panorama, declines AFP and Horizon  Ultrasound discussed; fetal survey: requested CCNC completed> form faxed if has or is planning to apply for medicaid The nature of [place] [facility] [employer] with multiple MDs and other Advanced Practice Providers was explained to patient; also emphasized that fellows, residents, and students are part of our team. Does have home bp cuff. Office bp cuff given: no. Rx sent: no. Check bp weekly, let us  know if consistently >140/90.   Follow-up: Return in about 4 weeks (around 06/05/2024) for LROB, CNM, in person; then @ 19-20w for anatomy u/s and LROB w/ CNM.   Orders Placed This Encounter  Procedures   Urine Culture   CBC/D/Plt+RPR+Rh+ABO+RubIgG...   Hemoglobin A1c   PANORAMA PRENATAL TEST   Amb ref to [facility]    Suzen JONELLE Fetters CNM, [facility] 05/08/2024 2:58 PM  "

## 2024-05-09 LAB — CBC/D/PLT+RPR+RH+ABO+RUBIGG...
Antibody Screen: NEGATIVE
Basophils Absolute: 0 10*3/uL (ref 0.0–0.2)
Basos: 0 %
EOS (ABSOLUTE): 0.1 10*3/uL (ref 0.0–0.4)
Eos: 1 %
HCV Ab: NONREACTIVE
HIV Screen 4th Generation wRfx: NONREACTIVE
Hematocrit: 37.7 % (ref 34.0–46.6)
Hemoglobin: 12.4 g/dL (ref 11.1–15.9)
Hepatitis B Surface Ag: NEGATIVE
Immature Grans (Abs): 0 10*3/uL (ref 0.0–0.1)
Immature Granulocytes: 0 %
Lymphocytes Absolute: 1.7 10*3/uL (ref 0.7–3.1)
Lymphs: 19 %
MCH: 30 pg (ref 26.6–33.0)
MCHC: 32.9 g/dL (ref 31.5–35.7)
MCV: 91 fL (ref 79–97)
Monocytes Absolute: 0.6 10*3/uL (ref 0.1–0.9)
Monocytes: 7 %
Neutrophils Absolute: 6.4 10*3/uL (ref 1.4–7.0)
Neutrophils: 73 %
Platelets: 330 10*3/uL (ref 150–450)
RBC: 4.13 x10E6/uL (ref 3.77–5.28)
RDW: 13.2 % (ref 11.7–15.4)
RPR Ser Ql: NONREACTIVE
Rh Factor: POSITIVE
Rubella Antibodies, IGG: 1.2 {index}
WBC: 8.8 10*3/uL (ref 3.4–10.8)

## 2024-05-09 LAB — HCV INTERPRETATION

## 2024-05-09 LAB — HEMOGLOBIN A1C
Est. average glucose Bld gHb Est-mCnc: 97 mg/dL
Hgb A1c MFr Bld: 5 % (ref 4.8–5.6)

## 2024-05-15 ENCOUNTER — Encounter: Payer: Self-pay | Admitting: Women's Health

## 2024-05-15 ENCOUNTER — Encounter: Payer: Self-pay | Admitting: *Deleted

## 2024-06-05 ENCOUNTER — Encounter: Payer: Self-pay | Admitting: Women's Health

## 2024-07-10 ENCOUNTER — Encounter: Payer: Self-pay | Admitting: Women's Health

## 2024-07-10 ENCOUNTER — Other Ambulatory Visit: Payer: Self-pay
